# Patient Record
Sex: Female | Born: 1960 | Race: White | Hispanic: No | Marital: Married | Smoking: Never smoker
Health system: Southern US, Community
[De-identification: ages and names within clinical notes are randomized; demographics above are authoritative.]

## PROBLEM LIST (undated history)

## (undated) DIAGNOSIS — K219 Gastro-esophageal reflux disease without esophagitis: Secondary | ICD-10-CM

## (undated) DIAGNOSIS — F32A Depression, unspecified: Secondary | ICD-10-CM

## (undated) DIAGNOSIS — Z8632 Personal history of gestational diabetes: Secondary | ICD-10-CM

## (undated) DIAGNOSIS — F329 Major depressive disorder, single episode, unspecified: Secondary | ICD-10-CM

## (undated) DIAGNOSIS — E7800 Pure hypercholesterolemia, unspecified: Secondary | ICD-10-CM

## (undated) DIAGNOSIS — A609 Anogenital herpesviral infection, unspecified: Secondary | ICD-10-CM

## (undated) DIAGNOSIS — N3281 Overactive bladder: Secondary | ICD-10-CM

## (undated) DIAGNOSIS — F419 Anxiety disorder, unspecified: Secondary | ICD-10-CM

## (undated) DIAGNOSIS — M810 Age-related osteoporosis without current pathological fracture: Secondary | ICD-10-CM

## (undated) DIAGNOSIS — M549 Dorsalgia, unspecified: Secondary | ICD-10-CM

## (undated) DIAGNOSIS — E221 Hyperprolactinemia: Secondary | ICD-10-CM

## (undated) HISTORY — DX: Anxiety disorder, unspecified: F41.9

## (undated) HISTORY — PX: BUNIONECTOMY: SHX129

## (undated) HISTORY — DX: Age-related osteoporosis without current pathological fracture: M81.0

## (undated) HISTORY — PX: OTHER SURGICAL HISTORY: SHX169

## (undated) HISTORY — DX: Dorsalgia, unspecified: M54.9

## (undated) HISTORY — DX: Hyperprolactinemia: E22.1

## (undated) HISTORY — DX: Gastro-esophageal reflux disease without esophagitis: K21.9

## (undated) HISTORY — DX: Depression, unspecified: F32.A

## (undated) HISTORY — DX: Pure hypercholesterolemia, unspecified: E78.00

## (undated) HISTORY — DX: Major depressive disorder, single episode, unspecified: F32.9

## (undated) HISTORY — PX: CERVICAL DISC SURGERY: SHX588

## (undated) HISTORY — DX: Personal history of gestational diabetes: Z86.32

## (undated) HISTORY — DX: Anogenital herpesviral infection, unspecified: A60.9

## (undated) HISTORY — DX: Overactive bladder: N32.81

## (undated) HISTORY — PX: TOTAL HIP ARTHROPLASTY: SHX124

---

## 1999-03-14 ENCOUNTER — Other Ambulatory Visit: Admission: RE | Admit: 1999-03-14 | Discharge: 1999-03-14 | Payer: Self-pay | Admitting: Gynecology

## 1999-08-30 ENCOUNTER — Other Ambulatory Visit: Admission: RE | Admit: 1999-08-30 | Discharge: 1999-08-30 | Payer: Self-pay | Admitting: Gynecology

## 1999-10-22 ENCOUNTER — Other Ambulatory Visit: Admission: RE | Admit: 1999-10-22 | Discharge: 1999-10-22 | Payer: Self-pay | Admitting: Gynecology

## 1999-10-22 ENCOUNTER — Encounter (INDEPENDENT_AMBULATORY_CARE_PROVIDER_SITE_OTHER): Payer: Self-pay | Admitting: Specialist

## 2002-04-15 ENCOUNTER — Other Ambulatory Visit: Admission: RE | Admit: 2002-04-15 | Discharge: 2002-04-15 | Payer: Self-pay | Admitting: Gynecology

## 2003-05-16 ENCOUNTER — Other Ambulatory Visit: Admission: RE | Admit: 2003-05-16 | Discharge: 2003-05-16 | Payer: Self-pay | Admitting: Gynecology

## 2004-05-17 ENCOUNTER — Other Ambulatory Visit: Admission: RE | Admit: 2004-05-17 | Discharge: 2004-05-17 | Payer: Self-pay | Admitting: Gynecology

## 2005-05-20 ENCOUNTER — Other Ambulatory Visit: Admission: RE | Admit: 2005-05-20 | Discharge: 2005-05-20 | Payer: Self-pay | Admitting: Gynecology

## 2006-06-26 ENCOUNTER — Other Ambulatory Visit: Admission: RE | Admit: 2006-06-26 | Discharge: 2006-06-26 | Payer: Self-pay | Admitting: Gynecology

## 2007-06-30 ENCOUNTER — Other Ambulatory Visit: Admission: RE | Admit: 2007-06-30 | Discharge: 2007-06-30 | Payer: Self-pay | Admitting: Gynecology

## 2008-03-18 ENCOUNTER — Ambulatory Visit: Payer: Self-pay | Admitting: Gynecology

## 2008-05-30 ENCOUNTER — Emergency Department (HOSPITAL_BASED_OUTPATIENT_CLINIC_OR_DEPARTMENT_OTHER): Admission: EM | Admit: 2008-05-30 | Discharge: 2008-05-30 | Payer: Self-pay | Admitting: *Deleted

## 2008-07-08 ENCOUNTER — Other Ambulatory Visit: Admission: RE | Admit: 2008-07-08 | Discharge: 2008-07-08 | Payer: Self-pay | Admitting: Gynecology

## 2008-07-08 ENCOUNTER — Ambulatory Visit: Payer: Self-pay | Admitting: Gynecology

## 2008-07-08 ENCOUNTER — Encounter: Payer: Self-pay | Admitting: Gynecology

## 2008-10-28 ENCOUNTER — Ambulatory Visit: Payer: Self-pay | Admitting: Gynecology

## 2009-08-21 ENCOUNTER — Ambulatory Visit: Payer: Self-pay | Admitting: Gynecology

## 2009-08-21 ENCOUNTER — Other Ambulatory Visit: Admission: RE | Admit: 2009-08-21 | Discharge: 2009-08-21 | Payer: Self-pay | Admitting: Gynecology

## 2009-12-13 ENCOUNTER — Ambulatory Visit: Payer: Self-pay | Admitting: Gynecology

## 2010-10-01 ENCOUNTER — Encounter (INDEPENDENT_AMBULATORY_CARE_PROVIDER_SITE_OTHER): Payer: BC Managed Care – PPO | Admitting: Gynecology

## 2010-10-01 ENCOUNTER — Other Ambulatory Visit (HOSPITAL_COMMUNITY)
Admission: RE | Admit: 2010-10-01 | Discharge: 2010-10-01 | Disposition: A | Discharge status: Home / Self Care | Payer: BC Managed Care – PPO | Source: Ambulatory Visit | Attending: Gynecology | Admitting: Gynecology

## 2010-10-01 ENCOUNTER — Other Ambulatory Visit: Payer: Self-pay | Admitting: Gynecology

## 2010-10-01 DIAGNOSIS — Z01419 Encounter for gynecological examination (general) (routine) without abnormal findings: Secondary | ICD-10-CM

## 2010-10-01 DIAGNOSIS — Z124 Encounter for screening for malignant neoplasm of cervix: Secondary | ICD-10-CM | POA: Insufficient documentation

## 2010-10-01 DIAGNOSIS — E229 Hyperfunction of pituitary gland, unspecified: Secondary | ICD-10-CM

## 2010-10-01 DIAGNOSIS — R635 Abnormal weight gain: Secondary | ICD-10-CM

## 2010-10-01 DIAGNOSIS — Z833 Family history of diabetes mellitus: Secondary | ICD-10-CM

## 2010-10-01 DIAGNOSIS — Z13220 Encounter for screening for lipoid disorders: Secondary | ICD-10-CM

## 2010-10-03 ENCOUNTER — Other Ambulatory Visit: Payer: Self-pay | Admitting: Gynecology

## 2010-10-03 DIAGNOSIS — D352 Benign neoplasm of pituitary gland: Secondary | ICD-10-CM

## 2010-10-09 ENCOUNTER — Ambulatory Visit (HOSPITAL_COMMUNITY)
Admission: RE | Admit: 2010-10-09 | Discharge: 2010-10-09 | Disposition: A | Discharge status: Home / Self Care | Payer: BC Managed Care – PPO | Source: Ambulatory Visit | Attending: Gynecology | Admitting: Gynecology

## 2010-10-09 DIAGNOSIS — D352 Benign neoplasm of pituitary gland: Secondary | ICD-10-CM

## 2010-10-09 DIAGNOSIS — F329 Major depressive disorder, single episode, unspecified: Secondary | ICD-10-CM | POA: Insufficient documentation

## 2010-10-09 DIAGNOSIS — E229 Hyperfunction of pituitary gland, unspecified: Secondary | ICD-10-CM | POA: Insufficient documentation

## 2010-10-09 DIAGNOSIS — F3289 Other specified depressive episodes: Secondary | ICD-10-CM | POA: Insufficient documentation

## 2010-10-09 MED ORDER — GADOBENATE DIMEGLUMINE 529 MG/ML IV SOLN
10.0000 mL | Freq: Once | INTRAVENOUS | Status: AC | PRN
  Administered 2010-10-09: 10 mL via INTRAVENOUS

## 2010-11-05 ENCOUNTER — Other Ambulatory Visit: Payer: BC Managed Care – PPO

## 2011-11-27 ENCOUNTER — Encounter: Payer: Self-pay | Admitting: *Deleted

## 2011-12-04 ENCOUNTER — Ambulatory Visit (INDEPENDENT_AMBULATORY_CARE_PROVIDER_SITE_OTHER): Payer: BC Managed Care – PPO | Admitting: Gynecology

## 2011-12-04 ENCOUNTER — Encounter: Payer: Self-pay | Admitting: Gynecology

## 2011-12-04 VITALS — BP 112/70 | Ht 63.0 in | Wt 164.0 lb

## 2011-12-04 DIAGNOSIS — F419 Anxiety disorder, unspecified: Secondary | ICD-10-CM | POA: Insufficient documentation

## 2011-12-04 DIAGNOSIS — M8580 Other specified disorders of bone density and structure, unspecified site: Secondary | ICD-10-CM

## 2011-12-04 DIAGNOSIS — Z131 Encounter for screening for diabetes mellitus: Secondary | ICD-10-CM

## 2011-12-04 DIAGNOSIS — Z01419 Encounter for gynecological examination (general) (routine) without abnormal findings: Secondary | ICD-10-CM

## 2011-12-04 DIAGNOSIS — E221 Hyperprolactinemia: Secondary | ICD-10-CM

## 2011-12-04 DIAGNOSIS — F32A Depression, unspecified: Secondary | ICD-10-CM | POA: Insufficient documentation

## 2011-12-04 DIAGNOSIS — F411 Generalized anxiety disorder: Secondary | ICD-10-CM

## 2011-12-04 DIAGNOSIS — F329 Major depressive disorder, single episode, unspecified: Secondary | ICD-10-CM

## 2011-12-04 DIAGNOSIS — M949 Disorder of cartilage, unspecified: Secondary | ICD-10-CM

## 2011-12-04 DIAGNOSIS — E229 Hyperfunction of pituitary gland, unspecified: Secondary | ICD-10-CM

## 2011-12-04 DIAGNOSIS — Z13220 Encounter for screening for lipoid disorders: Secondary | ICD-10-CM

## 2011-12-04 LAB — CBC WITH DIFFERENTIAL/PLATELET
Basophils Absolute: 0 10*3/uL (ref 0.0–0.1)
Basophils Relative: 1 % (ref 0–1)
Eosinophils Relative: 3 % (ref 0–5)
HCT: 39.8 % (ref 36.0–46.0)
MCH: 29.1 pg (ref 26.0–34.0)
MCHC: 32.7 g/dL (ref 30.0–36.0)
MCV: 89.2 fL (ref 78.0–100.0)
Monocytes Absolute: 0.3 10*3/uL (ref 0.1–1.0)
RDW: 13.7 % (ref 11.5–15.5)

## 2011-12-04 NOTE — Progress Notes (Addendum)
Cyrstal Leitz Wieseler [birth year] [id number]        51 y.o.  for annual exam.  Several issues noted below.  Past medical history,surgical history, medications, allergies, family history and social history were all reviewed and documented in the EPIC chart. ROS:  Was performed and pertinent positives and negatives are included in the history.  Exam: Sherrilyn Rist chaperone present Filed Vitals:   12/04/11 1115  BP: 112/70   General appearance  Normal Skin grossly normal Head/Neck normal with no cervical or supraclavicular adenopathy thyroid normal Lungs  clear Cardiac RR, without RMG Abdominal  soft, nontender, without masses, organomegaly or hernia Breasts  examined lying and sitting without masses, retractions, discharge or axillary adenopathy. Pelvic  Ext/BUS/vagina  normal   Cervix  normal   Uterus  Axial to retroverted, normal size, shape and contour, midline and mobile nontender   Adnexa  Without masses or tenderness    Anus and perineum  normal   Rectovaginal  normal sphincter tone without palpated masses or tenderness.    Assessment/Plan:  51 y.o. female for annual exam.    1. Hyperprolactinemia. Patient has history of hyperprolactinemia in the 30 range although her values last year was 57. An MRI was normal in 2012. Will check prolactin now, as long as within similar range then we'll monitor. She's having no symptoms such as visual, headache or galactorrhea. 2. Postmenopausal. Patient off HRT doing well without symptoms or bleeding. 3. Mammography. Patient knows to schedule now she agrees to do so. SBE monthly reviewed. 4. Colonoscopy. Patient overdo and knows to schedule and agrees to do so. 5. Pap smear. No Pap smear done today. Patient has no history of abnormal Pap smears with last Pap smear 2012. She has numerous normal reports in her chart. I reviewed current screening guidelines we'll plan on less frequent screening every 3-5 years. 6. Osteopenia. DEXA 11/2009 showed T score -1.3. She  had been on Fosamax previously through her orthopedist due to fracture history. She did fracture this past year falling out of bed with a vertebral and foot fracture. We'll repeat DEXA now. Also check vitamin D level. 7. Depression. Patient is actively being followed and is on several medications by another physician. 8. Health maintenance. Baseline CBC glucose lipid profile urinalysis ordered along with her other blood work.    Dara Lords MD, 11:44 AM 12/04/2011

## 2011-12-04 NOTE — Patient Instructions (Signed)
Follow up for bone density as scheduled. Office will call you with your lab results. Otherwise follow up in one year for annual gynecologic exam.

## 2011-12-05 LAB — URINALYSIS W MICROSCOPIC + REFLEX CULTURE
Bacteria, UA: NONE SEEN
Crystals: NONE SEEN
Ketones, ur: NEGATIVE mg/dL
Nitrite: NEGATIVE
Specific Gravity, Urine: 1.022 (ref 1.005–1.030)
Urobilinogen, UA: 0.2 mg/dL (ref 0.0–1.0)

## 2011-12-05 LAB — VITAMIN D 25 HYDROXY (VIT D DEFICIENCY, FRACTURES): Vit D, 25-Hydroxy: 66 ng/mL (ref 30–89)

## 2011-12-05 LAB — LIPID PANEL
LDL Cholesterol: 127 mg/dL — ABNORMAL HIGH (ref 0–99)
Triglycerides: 79 mg/dL (ref <150)
VLDL: 16 mg/dL (ref 0–40)

## 2011-12-05 LAB — PROLACTIN: Prolactin: 35.8 ng/mL

## 2011-12-09 ENCOUNTER — Encounter: Payer: Self-pay | Admitting: Gynecology

## 2011-12-10 ENCOUNTER — Ambulatory Visit (INDEPENDENT_AMBULATORY_CARE_PROVIDER_SITE_OTHER): Payer: BC Managed Care – PPO

## 2011-12-10 DIAGNOSIS — M8580 Other specified disorders of bone density and structure, unspecified site: Secondary | ICD-10-CM

## 2011-12-10 DIAGNOSIS — Z13820 Encounter for screening for osteoporosis: Secondary | ICD-10-CM

## 2011-12-12 ENCOUNTER — Encounter: Payer: Self-pay | Admitting: Gynecology

## 2011-12-16 ENCOUNTER — Encounter: Payer: Self-pay | Admitting: Gynecology

## 2012-01-24 ENCOUNTER — Ambulatory Visit (AMBULATORY_SURGERY_CENTER): Payer: BC Managed Care – PPO | Admitting: *Deleted

## 2012-01-24 VITALS — Ht 63.0 in | Wt 152.6 lb

## 2012-01-24 DIAGNOSIS — Z1211 Encounter for screening for malignant neoplasm of colon: Secondary | ICD-10-CM

## 2012-01-24 MED ORDER — MOVIPREP 100 G PO SOLR: ORAL | Status: DC

## 2012-02-06 ENCOUNTER — Ambulatory Visit (AMBULATORY_SURGERY_CENTER): Payer: BC Managed Care – PPO | Admitting: Internal Medicine

## 2012-02-06 ENCOUNTER — Encounter: Payer: Self-pay | Admitting: Internal Medicine

## 2012-02-06 VITALS — BP 100/87 | HR 68 | Temp 95.9°F | Resp 11 | Ht 63.0 in | Wt 152.0 lb

## 2012-02-06 DIAGNOSIS — Z1211 Encounter for screening for malignant neoplasm of colon: Secondary | ICD-10-CM

## 2012-02-06 MED ORDER — SODIUM CHLORIDE 0.9 % IV SOLN: 500.0000 mL | INTRAVENOUS | Status: DC

## 2012-02-06 NOTE — Op Note (Signed)
[facility] [address] [place], [place]  [address]  COLONOSCOPY PROCEDURE REPORT  PATIENT:  [name], Jeanne  MR#:  [id number] BIRTHDATE:  03/03/1961, 51 yrs. old  GENDER:  female ENDOSCOPIST:  Hedwig Morton. Juanda Chance, MD REF. BY:  Colin Broach, M.D. PROCEDURE DATE:  02/06/2012 PROCEDURE:  Colonoscopy 62130 ASA CLASS:  Class I INDICATIONS:  colorectal cancer screening, average risk MEDICATIONS:   MAC sedation, administered by CRNA, propofol (Diprivan) 250 mg  DESCRIPTION OF PROCEDURE:   After the risks and benefits and of the procedure were explained, informed consent was obtained. Digital rectal exam was performed and revealed no rectal masses. The LB CF-[id number] endoscope was introduced through the anus and advanced to the cecum, which was identified by both the appendix and ileocecal valve.  The quality of the prep was excellent, using MoviPrep.  The instrument was then slowly withdrawn as the colon was fully examined. <<PROCEDUREIMAGES>>  FINDINGS:  No polyps or cancers were seen (see image1, image2, and image3).   Retroflexed views in the rectum revealed no abnormalities.    The scope was then withdrawn from the patient and the procedure completed.  COMPLICATIONS:  None ENDOSCOPIC IMPRESSION: 1) No polyps or cancers 2) Normal colonoscopy RECOMMENDATIONS: 1) High fiber diet.  REPEAT EXAM:  In 10 year(s) for.  ______________________________ Hedwig Morton. Juanda Chance, MD  CC:  n. eSIGNED:   Hedwig Morton. Brodie at 02/06/2012 10:48 AM  Figuereo, Erskine Squibb, [phone]

## 2012-02-06 NOTE — Patient Instructions (Addendum)
YOU HAD AN ENDOSCOPIC PROCEDURE TODAY AT THE [facility]: Refer to the procedure report that was given to you for any specific questions about what was found during the examination.  If the procedure report does not answer your questions, please call your gastroenterologist to clarify.  If you requested that your care partner not be given the details of your procedure findings, then the procedure report has been included in a sealed envelope for you to review at your convenience later.  YOU SHOULD EXPECT: Some feelings of bloating in the abdomen. Passage of more gas than usual.  Walking can help get rid of the air that was put into your GI tract during the procedure and reduce the bloating. If you had a lower endoscopy (such as a colonoscopy or flexible sigmoidoscopy) you may notice spotting of blood in your stool or on the toilet paper. If you underwent a bowel prep for your procedure, then you may not have a normal bowel movement for a few days.  DIET: Your first meal following the procedure should be a light meal and then it is ok to progress to your normal diet.  A half-sandwich or bowl of soup is an example of a good first meal.  Heavy or fried foods are harder to digest and may make you feel nauseous or bloated.  Likewise meals heavy in dairy and vegetables can cause extra gas to form and this can also increase the bloating.  Drink plenty of fluids but you should avoid alcoholic beverages for 24 hours.  ACTIVITY: Your care partner should take you home directly after the procedure.  You should plan to take it easy, moving slowly for the rest of the day.  You can resume normal activity the day after the procedure however you should NOT DRIVE or use heavy machinery for 24 hours (because of the sedation medicines used during the test).    SYMPTOMS TO REPORT IMMEDIATELY: A gastroenterologist can be reached at any hour.  During normal business hours, 8:30 AM to 5:00 PM Monday through Friday,  call [phone].  After hours and on weekends, please call the GI answering service at [phone] who will take a message and have the physician on call contact you.   Following lower endoscopy (colonoscopy or flexible sigmoidoscopy):  Excessive amounts of blood in the stool  Significant tenderness or worsening of abdominal pains  Swelling of the abdomen that is new, acute  Fever of 100F or higher  Following upper endoscopy (EGD)  Vomiting of blood or coffee ground material  New chest pain or pain under the shoulder blades  Painful or persistently difficult swallowing  New shortness of breath  Fever of 100F or higher  Black, tarry-looking stools  FOLLOW UP: If any biopsies were taken you will be contacted by phone or by letter within the next 1-3 weeks.  Call your gastroenterologist if you have not heard about the biopsies in 3 weeks.  Our staff will call the home number listed on your records the next business day following your procedure to check on you and address any questions or concerns that you may have at that time regarding the information given to you following your procedure. This is a courtesy call and so if there is no answer at the home number and we have not heard from you through the emergency physician on call, we will assume that you have returned to your regular daily activities without incident.  SIGNATURES/CONFIDENTIALITY: You and/or your care   partner have signed paperwork which will be entered into your electronic medical record.  These signatures attest to the fact that that the information above on your After Visit Summary has been reviewed and is understood.  Full responsibility of the confidentiality of this discharge information lies with you and/or your care-partner.  Normal colonoscopy.  High fiber diet information given. . 

## 2012-02-06 NOTE — Progress Notes (Signed)
Patient did not experience any of the following events: a burn prior to discharge; a fall within the facility; wrong site/side/patient/procedure/implant event; or a hospital transfer or hospital admission upon discharge from the facility. (G8907) Patient did not have preoperative order for IV antibiotic SSI prophylaxis. (G8918)  

## 2012-02-07 ENCOUNTER — Telehealth: Payer: Self-pay | Admitting: *Deleted

## 2012-02-07 NOTE — Telephone Encounter (Signed)
Left message on number given in admitting yesterday. ewm 

## 2012-12-09 ENCOUNTER — Encounter: Payer: Self-pay | Admitting: Gynecology

## 2012-12-31 ENCOUNTER — Encounter: Payer: Self-pay | Admitting: Gynecology

## 2012-12-31 ENCOUNTER — Ambulatory Visit (INDEPENDENT_AMBULATORY_CARE_PROVIDER_SITE_OTHER): Payer: BC Managed Care – PPO | Admitting: Gynecology

## 2012-12-31 VITALS — BP 120/64 | Ht 63.0 in | Wt 130.0 lb

## 2012-12-31 DIAGNOSIS — Z13220 Encounter for screening for lipoid disorders: Secondary | ICD-10-CM

## 2012-12-31 DIAGNOSIS — E229 Hyperfunction of pituitary gland, unspecified: Secondary | ICD-10-CM

## 2012-12-31 DIAGNOSIS — E221 Hyperprolactinemia: Secondary | ICD-10-CM

## 2012-12-31 DIAGNOSIS — Z01419 Encounter for gynecological examination (general) (routine) without abnormal findings: Secondary | ICD-10-CM

## 2012-12-31 LAB — CBC WITH DIFFERENTIAL/PLATELET
Eosinophils Relative: 2 % (ref 0–5)
Hemoglobin: 13.2 g/dL (ref 12.0–15.0)
Lymphocytes Relative: 27 % (ref 12–46)
Lymphs Abs: 1.8 10*3/uL (ref 0.7–4.0)
MCV: 87.2 fL (ref 78.0–100.0)
Monocytes Relative: 6 % (ref 3–12)
Platelets: 311 10*3/uL (ref 150–400)
RBC: 4.44 MIL/uL (ref 3.87–5.11)
WBC: 6.4 10*3/uL (ref 4.0–10.5)

## 2012-12-31 LAB — TSH: TSH: 1.757 u[IU]/mL (ref 0.350–4.500)

## 2012-12-31 LAB — LIPID PANEL
Cholesterol: 235 mg/dL — ABNORMAL HIGH (ref 0–200)
HDL: 71 mg/dL (ref >39)
Triglycerides: 60 mg/dL (ref <150)

## 2012-12-31 LAB — COMPREHENSIVE METABOLIC PANEL
ALT: 25 U/L (ref 0–35)
CO2: 29 mEq/L (ref 19–32)
Calcium: 9.6 mg/dL (ref 8.4–10.5)
Chloride: 99 mEq/L (ref 96–112)
Creat: 0.96 mg/dL (ref 0.50–1.10)
Sodium: 136 mEq/L (ref 135–145)
Total Protein: 6.8 g/dL (ref 6.0–8.3)

## 2012-12-31 LAB — PROLACTIN: Prolactin: 37.2 ng/mL

## 2012-12-31 NOTE — Progress Notes (Signed)
Mckenley Birenbaum Karn [birth year] [id number]        52 y.o.  [id number] for annual exam.  Doing well without complaints.  Past medical history,surgical history, medications, allergies, family history and social history were all reviewed and documented in the EPIC chart.  ROS:  Performed and pertinent positives and negatives are included in the history, assessment and plan .  Exam: Kim assistant Filed Vitals:   12/31/12 0842  BP: 120/64  Height: 5\' 3"  (1.6 m)  Weight: 130 lb (58.968 kg)   General appearance  Normal Skin grossly normal Head/Neck normal with no cervical or supraclavicular adenopathy thyroid normal Lungs  clear Cardiac RR, without RMG Abdominal  soft, nontender, without masses, organomegaly or hernia Breasts  examined lying and sitting without masses, retractions, discharge or axillary adenopathy. Pelvic  Ext/BUS/vagina  normal   Cervix  normal   Uterus  retroverted, normal size, shape and contour, midline and mobile nontender   Adnexa  Without masses or tenderness    Anus and perineum  normal   Rectovaginal  normal sphincter tone without palpated masses or tenderness.    Assessment/Plan:  52 y.o. [id number] female for annual exam.   1. Postmenopausal. Doing well without significant hot flashes night sweats vaginal dryness or dyspareunia. Continue to monitor. 2. Hyperprolactinemia. History of hyperprolactinemia with normal MRI 2012. Value 35 last year. Recheck prolactin this year. 3. Depression. Patient actively being followed by another physician and doing well. Continue to followup with them. 4. Mammography 11/2012. Continue with annual mammography. SBE monthly reviewed. 5. Pap smear 2012. No Pap smear done today. No history of significant abnormal Pap smears. Plan repeat Pap smear next year 3 year interval. 6. Colonoscopy 2013. Repeated their recommended interval. 7. DEXA 2013 normal. Plan repeat in 5 year interval. Increase calcium vitamin D reviewed. Check vitamin D level  today. 8. Health maintenance. Baseline CBC comprehensive metabolic panel lipid profile urinalysis TSH vitamin D and prolactin ordered. Follow up one year, sooner as needed.  Note: This document was prepared with a combination of digital dictation and possible smart phrase technology. Any transcriptional errors that result from this process are unintentional.   Dara Lords MD, 9:17 AM 12/31/2012

## 2012-12-31 NOTE — Patient Instructions (Signed)
Follow up in one year, sooner as needed. 

## 2013-01-01 LAB — URINALYSIS W MICROSCOPIC + REFLEX CULTURE
Bacteria, UA: NONE SEEN
Bilirubin Urine: NEGATIVE
Casts: NONE SEEN
Glucose, UA: NEGATIVE mg/dL
Hgb urine dipstick: NEGATIVE
Ketones, ur: NEGATIVE mg/dL
Leukocytes, UA: NEGATIVE
pH: 7.5 (ref 5.0–8.0)

## 2013-12-10 ENCOUNTER — Encounter: Payer: Self-pay | Admitting: Gynecology

## 2013-12-29 HISTORY — PX: BREAST SURGERY: SHX581

## 2014-01-31 ENCOUNTER — Ambulatory Visit (INDEPENDENT_AMBULATORY_CARE_PROVIDER_SITE_OTHER): Payer: 59 | Admitting: Gynecology

## 2014-01-31 ENCOUNTER — Other Ambulatory Visit (HOSPITAL_COMMUNITY)
Admission: RE | Admit: 2014-01-31 | Discharge: 2014-01-31 | Disposition: A | Discharge status: Home / Self Care | Payer: Commercial Managed Care - PPO | Source: Ambulatory Visit | Attending: Gynecology | Admitting: Gynecology

## 2014-01-31 ENCOUNTER — Encounter: Payer: Self-pay | Admitting: Gynecology

## 2014-01-31 VITALS — BP 122/78 | Ht 62.0 in | Wt 144.0 lb

## 2014-01-31 DIAGNOSIS — Z1151 Encounter for screening for human papillomavirus (HPV): Secondary | ICD-10-CM | POA: Insufficient documentation

## 2014-01-31 DIAGNOSIS — Z01419 Encounter for gynecological examination (general) (routine) without abnormal findings: Secondary | ICD-10-CM

## 2014-01-31 DIAGNOSIS — E229 Hyperfunction of pituitary gland, unspecified: Secondary | ICD-10-CM

## 2014-01-31 DIAGNOSIS — A609 Anogenital herpesviral infection, unspecified: Secondary | ICD-10-CM

## 2014-01-31 DIAGNOSIS — E221 Hyperprolactinemia: Secondary | ICD-10-CM

## 2014-01-31 DIAGNOSIS — B009 Herpesviral infection, unspecified: Secondary | ICD-10-CM

## 2014-01-31 LAB — CBC WITH DIFFERENTIAL/PLATELET
BASOS ABS: 0 10*3/uL (ref 0.0–0.1)
Basophils Relative: 0 % (ref 0–1)
EOS PCT: 2 % (ref 0–5)
Eosinophils Absolute: 0.1 10*3/uL (ref 0.0–0.7)
HEMATOCRIT: 39.4 % (ref 36.0–46.0)
Hemoglobin: 13.6 g/dL (ref 12.0–15.0)
LYMPHS ABS: 1.6 10*3/uL (ref 0.7–4.0)
LYMPHS PCT: 30 % (ref 12–46)
MCH: 30.2 pg (ref 26.0–34.0)
MCHC: 34.5 g/dL (ref 30.0–36.0)
MCV: 87.4 fL (ref 78.0–100.0)
MONO ABS: 0.3 10*3/uL (ref 0.1–1.0)
Monocytes Relative: 6 % (ref 3–12)
NEUTROS ABS: 3.3 10*3/uL (ref 1.7–7.7)
Neutrophils Relative %: 62 % (ref 43–77)
PLATELETS: 268 10*3/uL (ref 150–400)
RBC: 4.51 MIL/uL (ref 3.87–5.11)
RDW: 13.3 % (ref 11.5–15.5)
WBC: 5.3 10*3/uL (ref 4.0–10.5)

## 2014-01-31 LAB — LIPID PANEL
CHOLESTEROL: 184 mg/dL (ref 0–200)
HDL: 88 mg/dL (ref >39)
LDL Cholesterol: 84 mg/dL (ref 0–99)
Total CHOL/HDL Ratio: 2.1 Ratio
Triglycerides: 60 mg/dL (ref <150)
VLDL: 12 mg/dL (ref 0–40)

## 2014-01-31 LAB — COMPREHENSIVE METABOLIC PANEL
ALBUMIN: 4.4 g/dL (ref 3.5–5.2)
ALT: 19 U/L (ref 0–35)
AST: 19 U/L (ref 0–37)
Alkaline Phosphatase: 72 U/L (ref 39–117)
BUN: 19 mg/dL (ref 6–23)
CALCIUM: 9.7 mg/dL (ref 8.4–10.5)
CHLORIDE: 99 meq/L (ref 96–112)
CO2: 29 meq/L (ref 19–32)
CREATININE: 1.04 mg/dL (ref 0.50–1.10)
Glucose, Bld: 87 mg/dL (ref 70–99)
POTASSIUM: 4.4 meq/L (ref 3.5–5.3)
Sodium: 137 mEq/L (ref 135–145)
Total Bilirubin: 0.5 mg/dL (ref 0.2–1.2)
Total Protein: 6.8 g/dL (ref 6.0–8.3)

## 2014-01-31 LAB — TSH: TSH: 1.509 u[IU]/mL (ref 0.350–4.500)

## 2014-01-31 LAB — PROLACTIN: Prolactin: 28.9 ng/mL

## 2014-01-31 MED ORDER — VALACYCLOVIR HCL 500 MG PO TABS: 500.0000 mg | ORAL_TABLET | Freq: Two times a day (BID) | ORAL | Status: DC

## 2014-01-31 NOTE — Patient Instructions (Signed)
Followup in 1 year for annual exam, sooner if any issues.   You may obtain a copy of any labs that were done today by logging onto MyChart as outlined in the instructions provided with your AVS (after visit summary). The office will not call with normal lab results but certainly if there are any significant abnormalities then we will contact you.   Health Maintenance, Female A healthy lifestyle and preventative care can promote health and wellness.  Maintain regular health, dental, and eye exams.  Eat a healthy diet. Foods like vegetables, fruits, whole grains, low-fat dairy products, and lean protein foods contain the nutrients you need without too many calories. Decrease your intake of foods high in solid fats, added sugars, and salt. Get information about a proper diet from your caregiver, if necessary.  Regular physical exercise is one of the most important things you can do for your health. Most adults should get at least 150 minutes of moderate-intensity exercise (any activity that increases your heart rate and causes you to sweat) each week. In addition, most adults need muscle-strengthening exercises on 2 or more days a week.   Maintain a healthy weight. The body mass index (BMI) is a screening tool to identify possible weight problems. It provides an estimate of body fat based on height and weight. Your caregiver can help determine your BMI, and can help you achieve or maintain a healthy weight. For adults 20 years and older:  A BMI below 18.5 is considered underweight.  A BMI of 18.5 to 24.9 is normal.  A BMI of 25 to 29.9 is considered overweight.  A BMI of 30 and above is considered obese.  Maintain normal blood lipids and cholesterol by exercising and minimizing your intake of saturated fat. Eat a balanced diet with plenty of fruits and vegetables. Blood tests for lipids and cholesterol should begin at age 23 and be repeated every 5 years. If your lipid or cholesterol levels are  high, you are over 50, or you are a high risk for heart disease, you may need your cholesterol levels checked more frequently.Ongoing high lipid and cholesterol levels should be treated with medicines if diet and exercise are not effective.  If you smoke, find out from your caregiver how to quit. If you do not use tobacco, do not start.  Lung cancer screening is recommended for adults aged 58 80 years who are at high risk for developing lung cancer because of a history of smoking. Yearly low-dose computed tomography (CT) is recommended for people who have at least a 30-pack-year history of smoking and are a current smoker or have quit within the past 15 years. A pack year of smoking is smoking an average of 1 pack of cigarettes a day for 1 year (for example: 1 pack a day for 30 years or 2 packs a day for 15 years). Yearly screening should continue until the smoker has stopped smoking for at least 15 years. Yearly screening should also be stopped for people who develop a health problem that would prevent them from having lung cancer treatment.  If you are pregnant, do not drink alcohol. If you are breastfeeding, be very cautious about drinking alcohol. If you are not pregnant and choose to drink alcohol, do not exceed 1 drink per day. One drink is considered to be 12 ounces (355 mL) of beer, 5 ounces (148 mL) of wine, or 1.5 ounces (44 mL) of liquor.  Avoid use of street drugs. Do not share needles  with anyone. Ask for help if you need support or instructions about stopping the use of drugs.  High blood pressure causes heart disease and increases the risk of stroke. Blood pressure should be checked at least every 1 to 2 years. Ongoing high blood pressure should be treated with medicines, if weight loss and exercise are not effective.  If you are 12 to 55 years old, ask your caregiver if you should take aspirin to prevent strokes.  Diabetes screening involves taking a blood sample to check your fasting  blood sugar level. This should be done once every 3 years, after age 26, if you are within normal weight and without risk factors for diabetes. Testing should be considered at a younger age or be carried out more frequently if you are overweight and have at least 1 risk factor for diabetes.  Breast cancer screening is essential preventative care for women. You should practice "breast self-awareness." This means understanding the normal appearance and feel of your breasts and may include breast self-examination. Any changes detected, no matter how small, should be reported to a caregiver. Women in their 25s and 30s should have a clinical breast exam (CBE) by a caregiver as part of a regular health exam every 1 to 3 years. After age 10, women should have a CBE every year. Starting at age 24, women should consider having a mammogram (breast X-ray) every year. Women who have a family history of breast cancer should talk to their caregiver about genetic screening. Women at a high risk of breast cancer should talk to their caregiver about having an MRI and a mammogram every year.  Breast cancer gene (BRCA)-related cancer risk assessment is recommended for women who have family members with BRCA-related cancers. BRCA-related cancers include breast, ovarian, tubal, and peritoneal cancers. Having family members with these cancers may be associated with an increased risk for harmful changes (mutations) in the breast cancer genes BRCA1 and BRCA2. Results of the assessment will determine the need for genetic counseling and BRCA1 and BRCA2 testing.  The Pap test is a screening test for cervical cancer. Women should have a Pap test starting at age 61. Between ages 93 and 59, Pap tests should be repeated every 2 years. Beginning at age 75, you should have a Pap test every 3 years as long as the past 3 Pap tests have been normal. If you had a hysterectomy for a problem that was not cancer or a condition that could lead to  cancer, then you no longer need Pap tests. If you are between ages 63 and 52, and you have had normal Pap tests going back 10 years, you no longer need Pap tests. If you have had past treatment for cervical cancer or a condition that could lead to cancer, you need Pap tests and screening for cancer for at least 20 years after your treatment. If Pap tests have been discontinued, risk factors (such as a new sexual partner) need to be reassessed to determine if screening should be resumed. Some women have medical problems that increase the chance of getting cervical cancer. In these cases, your caregiver may recommend more frequent screening and Pap tests.  The human papillomavirus (HPV) test is an additional test that may be used for cervical cancer screening. The HPV test looks for the virus that can cause the cell changes on the cervix. The cells collected during the Pap test can be tested for HPV. The HPV test could be used to screen women aged  30 years and older, and should be used in women of any age who have unclear Pap test results. After the age of 52, women should have HPV testing at the same frequency as a Pap test.  Colorectal cancer can be detected and often prevented. Most routine colorectal cancer screening begins at the age of 60 and continues through age 4. However, your caregiver may recommend screening at an earlier age if you have risk factors for colon cancer. On a yearly basis, your caregiver may provide home test kits to check for hidden blood in the stool. Use of a small camera at the end of a tube, to directly examine the colon (sigmoidoscopy or colonoscopy), can detect the earliest forms of colorectal cancer. Talk to your caregiver about this at age 23, when routine screening begins. Direct examination of the colon should be repeated every 5 to 10 years through age 48, unless early forms of pre-cancerous polyps or small growths are found.  Hepatitis C blood testing is recommended for  all people born from [birth year] through 1967 and any individual with known risks for hepatitis C.  Practice safe sex. Use condoms and avoid high-risk sexual practices to reduce the spread of sexually transmitted infections (STIs). Sexually active women aged 73 and younger should be checked for Chlamydia, which is a common sexually transmitted infection. Older women with new or multiple partners should also be tested for Chlamydia. Testing for other STIs is recommended if you are sexually active and at increased risk.  Osteoporosis is a disease in which the bones lose minerals and strength with aging. This can result in serious bone fractures. The risk of osteoporosis can be identified using a bone density scan. Women ages 34 and over and women at risk for fractures or osteoporosis should discuss screening with their caregivers. Ask your caregiver whether you should be taking a calcium supplement or vitamin D to reduce the rate of osteoporosis.  Menopause can be associated with physical symptoms and risks. Hormone replacement therapy is available to decrease symptoms and risks. You should talk to your caregiver about whether hormone replacement therapy is right for you.  Use sunscreen. Apply sunscreen liberally and repeatedly throughout the day. You should seek shade when your shadow is shorter than you. Protect yourself by wearing long sleeves, pants, a wide-brimmed hat, and sunglasses year round, whenever you are outdoors.  Notify your caregiver of new moles or changes in moles, especially if there is a change in shape or color. Also notify your caregiver if a mole is larger than the size of a pencil eraser.  Stay current with your immunizations. Document Released: 12/31/2010 Document Revised: 10/12/2012 Document Reviewed: 12/31/2010 [facility] Patient Information 2014 [employer].

## 2014-01-31 NOTE — Addendum Note (Signed)
Addended by: Nelva Nay on: 01/31/2014 09:27 AM   Modules accepted: Orders

## 2014-01-31 NOTE — Progress Notes (Signed)
Giana Castner Steib [birth year] [id number]        54 y.o.  G4P0013 for annual exam.  Several issues noted below.  Past medical history,surgical history, problem list, medications, allergies, family history and social history were all reviewed and documented as reviewed in the EPIC chart.  ROS:  12 system ROS performed with pertinent positives and negatives included in the history, assessment and plan.   Additional significant findings :  None   Exam: Kim Counsellor Vitals:   01/31/14 0847  BP: 122/78  Height: 5\' 2"  (1.575 m)  Weight: 144 lb (65.318 kg)   General appearance:  Normal affect, orientation and appearance. Skin: Grossly normal HEENT: Without gross lesions.  No cervical or supraclavicular adenopathy. Thyroid normal.  Lungs:  Clear without wheezing, rales or rhonchi Cardiac: RR, without RMG Abdominal:  Soft, nontender, without masses, guarding, rebound, organomegaly or hernia Breasts:  Examined lying and sitting without masses, retractions, discharge or axillary adenopathy. Bilateral reduction scars noted. Pelvic:  Ext/BUS/vagina with mild generalized atrophic changes  Cervix with mild atrophic changes. Pap/HPV  Uterus anteverted, normal size, shape and contour, midline and mobile nontender   Adnexa  Without masses or tenderness    Anus and perineum  Normal   Rectovaginal  Normal sphincter tone without palpated masses or tenderness.    Assessment/Plan:  53 y.o. G76P0013 female for annual exam.   1. Postmenopausal/atrophic genital changes. Patient doing well without significant hot flashes, night sweats, vaginal dryness or dyspareunia. No vaginal bleeding. Continue to monitor. Report any vaginal bleeding. 2. Hyperprolactinemia. History of hyperprolactinemia with normal MRI 2012. Prolactin 37 last year which is stable. Recheck prolactin now. Continue to follow as long as it is within this range. Patient's asymptomatic with out breast symptoms or headache/visual  changes. 3. DEXA 2013 normal. History of prior DEXA 2011 with T score -1.3 at orthopedics office. Had been on Fosamax previously but had discontinued a number of years ago. Plan expectant management with repeat DEXA at five-year interval. Increase calcium vitamin D reviewed. Check vitamin D level today. 4. Depression. Patient actively being followed by her other physician on several medications which they are managing. 5. 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 Asked for refill on her Valtrex just have available in case of recurrence. The Valtrex 500 mg #20 with one refill provided. At the earliest onset of symptoms twice a day x5 days. 6. Pap smear 2012. Pap smear/HPV today. No history of significant abnormal Pap smears previously. Repeat at 3 at five-year interval per current screening guidelines assuming this Pap smear is normal. 7. Mammography 11/2013. Underwent reductive mammoplasty 12/2013. Scar is healed nicely. Continue with annual mammography. SBE monthly review. 8. Colonoscopy 2013. Repeat at their recommended interval. 9. Health maintenance. Baseline CBC comprehensive metabolic panel lipid profile urinalysis vitamin D TSH prolactin ordered. Patient is fasting this morning. Being followed for hypercholesterolemia on Lipitor. Will forward results to her primary physician for management. Followup in one year, sooner as needed.   Note: This document was prepared with digital dictation and possible smart phrase technology. Any transcriptional errors that result from this process are unintentional.   [name] MD, 9:15 AM 01/31/2014

## 2014-02-01 LAB — URINALYSIS W MICROSCOPIC + REFLEX CULTURE
Bacteria, UA: NONE SEEN
Bilirubin Urine: NEGATIVE
CRYSTALS: NONE SEEN
Casts: NONE SEEN
Glucose, UA: NEGATIVE mg/dL
Hgb urine dipstick: NEGATIVE
Ketones, ur: NEGATIVE mg/dL
LEUKOCYTES UA: NEGATIVE
NITRITE: NEGATIVE
PH: 6.5 (ref 5.0–8.0)
Protein, ur: NEGATIVE mg/dL
SPECIFIC GRAVITY, URINE: 1.018 (ref 1.005–1.030)
Squamous Epithelial / LPF: NONE SEEN
Urobilinogen, UA: 0.2 mg/dL (ref 0.0–1.0)

## 2014-02-01 LAB — VITAMIN D 25 HYDROXY (VIT D DEFICIENCY, FRACTURES): VIT D 25 HYDROXY: 59 ng/mL (ref 30–89)

## 2014-02-01 LAB — CYTOLOGY - PAP

## 2014-05-02 ENCOUNTER — Encounter: Payer: Self-pay | Admitting: Gynecology

## 2014-12-20 ENCOUNTER — Encounter: Payer: Self-pay | Admitting: Gynecology

## 2015-02-03 ENCOUNTER — Ambulatory Visit (INDEPENDENT_AMBULATORY_CARE_PROVIDER_SITE_OTHER): Payer: Commercial Managed Care - PPO | Admitting: Gynecology

## 2015-02-03 ENCOUNTER — Encounter: Payer: Self-pay | Admitting: Gynecology

## 2015-02-03 VITALS — BP 122/76 | Ht 62.0 in | Wt 143.0 lb

## 2015-02-03 DIAGNOSIS — Z01419 Encounter for gynecological examination (general) (routine) without abnormal findings: Secondary | ICD-10-CM

## 2015-02-03 DIAGNOSIS — E221 Hyperprolactinemia: Secondary | ICD-10-CM

## 2015-02-03 LAB — CBC WITH DIFFERENTIAL/PLATELET
Basophils Absolute: 0.1 10*3/uL (ref 0.0–0.1)
Basophils Relative: 1 % (ref 0–1)
Eosinophils Absolute: 0.1 10*3/uL (ref 0.0–0.7)
Eosinophils Relative: 1 % (ref 0–5)
HCT: 38.5 % (ref 36.0–46.0)
HEMOGLOBIN: 13.1 g/dL (ref 12.0–15.0)
LYMPHS PCT: 22 % (ref 12–46)
Lymphs Abs: 1.4 10*3/uL (ref 0.7–4.0)
MCH: 31 pg (ref 26.0–34.0)
MCHC: 34 g/dL (ref 30.0–36.0)
MCV: 91.2 fL (ref 78.0–100.0)
MONOS PCT: 6 % (ref 3–12)
MPV: 10.3 fL (ref 8.6–12.4)
Monocytes Absolute: 0.4 10*3/uL (ref 0.1–1.0)
Neutro Abs: 4.4 10*3/uL (ref 1.7–7.7)
Neutrophils Relative %: 70 % (ref 43–77)
Platelets: 281 10*3/uL (ref 150–400)
RBC: 4.22 MIL/uL (ref 3.87–5.11)
RDW: 13.5 % (ref 11.5–15.5)
WBC: 6.3 10*3/uL (ref 4.0–10.5)

## 2015-02-03 LAB — COMPREHENSIVE METABOLIC PANEL
ALK PHOS: 57 U/L (ref 33–130)
ALT: 22 U/L (ref 6–29)
AST: 25 U/L (ref 10–35)
Albumin: 4.2 g/dL (ref 3.6–5.1)
BUN: 20 mg/dL (ref 7–25)
CO2: 29 mmol/L (ref 20–31)
CREATININE: 0.99 mg/dL (ref 0.50–1.05)
Calcium: 10 mg/dL (ref 8.6–10.4)
Chloride: 100 mmol/L (ref 98–110)
GLUCOSE: 88 mg/dL (ref 65–99)
Potassium: 4.6 mmol/L (ref 3.5–5.3)
Sodium: 139 mmol/L (ref 135–146)
Total Bilirubin: 0.6 mg/dL (ref 0.2–1.2)
Total Protein: 6.8 g/dL (ref 6.1–8.1)

## 2015-02-03 LAB — LIPID PANEL
CHOL/HDL RATIO: 1.8 ratio (ref <=5.0)
CHOLESTEROL: 180 mg/dL (ref 125–200)
HDL: 98 mg/dL (ref >=46)
LDL Cholesterol: 72 mg/dL (ref <130)
Triglycerides: 49 mg/dL (ref <150)
VLDL: 10 mg/dL (ref <30)

## 2015-02-03 LAB — TSH: TSH: 1.238 u[IU]/mL (ref 0.350–4.500)

## 2015-02-03 LAB — PROLACTIN: Prolactin: 19.3 ng/mL

## 2015-02-03 NOTE — Progress Notes (Signed)
Jeanne [name] [birth year] [id number]        54 y.o.  G4P0013 for annual exam.  Doing well without complaints.  Past medical history,surgical history, problem list, medications, allergies, family history and social history were all reviewed and documented as reviewed in the EPIC chart.  ROS:  Performed with pertinent positives and negatives included in the history, assessment and plan.   Additional significant findings :  none   Exam: Kim Counsellor Vitals:   02/03/15 0912  BP: 122/76  Height: 5\' 2"  (1.575 m)  Weight: 143 lb (64.864 kg)   General appearance:  Normal affect, orientation and appearance. Skin: Grossly normal HEENT: Without gross lesions.  No cervical or supraclavicular adenopathy. Thyroid normal.  Lungs:  Clear without wheezing, rales or rhonchi Cardiac: RR, without RMG Abdominal:  Soft, nontender, without masses, guarding, rebound, organomegaly or hernia Breasts:  Examined lying and sitting without masses, retractions, discharge or axillary adenopathy.  Well-healed bilateral reduction scars noted Pelvic:  Ext/BUS/vagina normal with mild atrophic changes  Cervix normal with mild atrophic changes  Uterus anteverted, normal size, shape and contour, midline and mobile nontender   Adnexa  Without masses or tenderness    Anus and perineum  Normal   Rectovaginal  Normal sphincter tone without palpated masses or tenderness.    Assessment/Plan:  54 y.o. G78P0013 female for annual exam.   1. Postmenopausal/mild atrophic changes. Patient without significant symptoms of hot flushes, night sweats, vaginal dryness or dyspareunia. No vaginal bleeding. Continue to monitor report any issues. 2. Hyperprolactinemia. History of hyperprolactinemia with normal MRI 2012.  Prolactin 28 last year. 2014 value 37. Repeat prolactin today. 3. DEXA 2013 normal. DEXA 2011 T score -1.3 at orthopedics office. Had been on Fosamax but discontinued a number of years ago. Plan expectant management  with follow up DEXA at age 28. Check vitamin D level today. 4. Depression. Patients actively followed by another physician on several medications a are managing. 5. History of HSV. Has not had an outbreak in years. Will call if needs Valtrex. 6. Pap smear/HPV negative 2015.  No Pap smear done today. No history of abnormal Pap smears previously. Repeat at 3-5 year interval progression screening guidelines. 7. Mammography 11/2014. Continue with annual mammography. SBE monthly reviewed. 8. Colonoscopy 2013. Repeat at their recommended interval. 9. Health maintenance. Baseline labs to include CBC, has a metabolic panel lipid profile urinalysis vitamin D TSH and prolactin ordered. Follow up in one year, sooner as needed.   [name] MD, 9:37 AM 02/03/2015

## 2015-02-03 NOTE — Patient Instructions (Signed)
You may obtain a copy of any labs that were done today by logging onto MyChart as outlined in the instructions provided with your AVS (after visit summary). The office will not call with normal lab results but certainly if there are any significant abnormalities then we will contact you.   Health Maintenance, Female A healthy lifestyle and preventative care can promote health and wellness.  Maintain regular health, dental, and eye exams.  Eat a healthy diet. Foods like vegetables, fruits, whole grains, low-fat dairy products, and lean protein foods contain the nutrients you need without too many calories. Decrease your intake of foods high in solid fats, added sugars, and salt. Get information about a proper diet from your caregiver, if necessary.  Regular physical exercise is one of the most important things you can do for your health. Most adults should get at least 150 minutes of moderate-intensity exercise (any activity that increases your heart rate and causes you to sweat) each week. In addition, most adults need muscle-strengthening exercises on 2 or more days a week.   Maintain a healthy weight. The body mass index (BMI) is a screening tool to identify possible weight problems. It provides an estimate of body fat based on height and weight. Your caregiver can help determine your BMI, and can help you achieve or maintain a healthy weight. For adults 20 years and older:  A BMI below 18.5 is considered underweight.  A BMI of 18.5 to 24.9 is normal.  A BMI of 25 to 29.9 is considered overweight.  A BMI of 30 and above is considered obese.  Maintain normal blood lipids and cholesterol by exercising and minimizing your intake of saturated fat. Eat a balanced diet with plenty of fruits and vegetables. Blood tests for lipids and cholesterol should begin at age 61 and be repeated every 5 years. If your lipid or cholesterol levels are high, you are over 50, or you are a high risk for heart  disease, you may need your cholesterol levels checked more frequently.Ongoing high lipid and cholesterol levels should be treated with medicines if diet and exercise are not effective.  If you smoke, find out from your caregiver how to quit. If you do not use tobacco, do not start.  Lung cancer screening is recommended for adults aged 33 80 years who are at high risk for developing lung cancer because of a history of smoking. Yearly low-dose computed tomography (CT) is recommended for people who have at least a 30-pack-year history of smoking and are a current smoker or have quit within the past 15 years. A pack year of smoking is smoking an average of 1 pack of cigarettes a day for 1 year (for example: 1 pack a day for 30 years or 2 packs a day for 15 years). Yearly screening should continue until the smoker has stopped smoking for at least 15 years. Yearly screening should also be stopped for people who develop a health problem that would prevent them from having lung cancer treatment.  If you are pregnant, do not drink alcohol. If you are breastfeeding, be very cautious about drinking alcohol. If you are not pregnant and choose to drink alcohol, do not exceed 1 drink per day. One drink is considered to be 12 ounces (355 mL) of beer, 5 ounces (148 mL) of wine, or 1.5 ounces (44 mL) of liquor.  Avoid use of street drugs. Do not share needles with anyone. Ask for help if you need support or instructions about stopping  the use of drugs.  High blood pressure causes heart disease and increases the risk of stroke. Blood pressure should be checked at least every 1 to 2 years. Ongoing high blood pressure should be treated with medicines, if weight loss and exercise are not effective.  If you are 59 to 56 years old, ask your caregiver if you should take aspirin to prevent strokes.  Diabetes screening involves taking a blood sample to check your fasting blood sugar level. This should be done once every 3  years, after age 91, if you are within normal weight and without risk factors for diabetes. Testing should be considered at a younger age or be carried out more frequently if you are overweight and have at least 1 risk factor for diabetes.  Breast cancer screening is essential preventative care for women. You should practice "breast self-awareness." This means understanding the normal appearance and feel of your breasts and may include breast self-examination. Any changes detected, no matter how small, should be reported to a caregiver. Women in their 66s and 30s should have a clinical breast exam (CBE) by a caregiver as part of a regular health exam every 1 to 3 years. After age 101, women should have a CBE every year. Starting at age 100, women should consider having a mammogram (breast X-ray) every year. Women who have a family history of breast cancer should talk to their caregiver about genetic screening. Women at a high risk of breast cancer should talk to their caregiver about having an MRI and a mammogram every year.  Breast cancer gene (BRCA)-related cancer risk assessment is recommended for women who have family members with BRCA-related cancers. BRCA-related cancers include breast, ovarian, tubal, and peritoneal cancers. Having family members with these cancers may be associated with an increased risk for harmful changes (mutations) in the breast cancer genes BRCA1 and BRCA2. Results of the assessment will determine the need for genetic counseling and BRCA1 and BRCA2 testing.  The Pap test is a screening test for cervical cancer. Women should have a Pap test starting at age 57. Between ages 25 and 35, Pap tests should be repeated every 2 years. Beginning at age 37, you should have a Pap test every 3 years as long as the past 3 Pap tests have been normal. If you had a hysterectomy for a problem that was not cancer or a condition that could lead to cancer, then you no longer need Pap tests. If you are  between ages 50 and 76, and you have had normal Pap tests going back 10 years, you no longer need Pap tests. If you have had past treatment for cervical cancer or a condition that could lead to cancer, you need Pap tests and screening for cancer for at least 20 years after your treatment. If Pap tests have been discontinued, risk factors (such as a new sexual partner) need to be reassessed to determine if screening should be resumed. Some women have medical problems that increase the chance of getting cervical cancer. In these cases, your caregiver may recommend more frequent screening and Pap tests.  The human papillomavirus (HPV) test is an additional test that may be used for cervical cancer screening. The HPV test looks for the virus that can cause the cell changes on the cervix. The cells collected during the Pap test can be tested for HPV. The HPV test could be used to screen women aged 44 years and older, and should be used in women of any age  who have unclear Pap test results. After the age of 55, women should have HPV testing at the same frequency as a Pap test.  Colorectal cancer can be detected and often prevented. Most routine colorectal cancer screening begins at the age of 44 and continues through age 20. However, your caregiver may recommend screening at an earlier age if you have risk factors for colon cancer. On a yearly basis, your caregiver may provide home test kits to check for hidden blood in the stool. Use of a small camera at the end of a tube, to directly examine the colon (sigmoidoscopy or colonoscopy), can detect the earliest forms of colorectal cancer. Talk to your caregiver about this at age 86, when routine screening begins. Direct examination of the colon should be repeated every 5 to 10 years through age 13, unless early forms of pre-cancerous polyps or small growths are found.  Hepatitis C blood testing is recommended for all people born from [birth year] through 1967 and any  individual with known risks for hepatitis C.  Practice safe sex. Use condoms and avoid high-risk sexual practices to reduce the spread of sexually transmitted infections (STIs). Sexually active women aged 36 and younger should be checked for Chlamydia, which is a common sexually transmitted infection. Older women with new or multiple partners should also be tested for Chlamydia. Testing for other STIs is recommended if you are sexually active and at increased risk.  Osteoporosis is a disease in which the bones lose minerals and strength with aging. This can result in serious bone fractures. The risk of osteoporosis can be identified using a bone density scan. Women ages 20 and over and women at risk for fractures or osteoporosis should discuss screening with their caregivers. Ask your caregiver whether you should be taking a calcium supplement or vitamin D to reduce the rate of osteoporosis.  Menopause can be associated with physical symptoms and risks. Hormone replacement therapy is available to decrease symptoms and risks. You should talk to your caregiver about whether hormone replacement therapy is right for you.  Use sunscreen. Apply sunscreen liberally and repeatedly throughout the day. You should seek shade when your shadow is shorter than you. Protect yourself by wearing long sleeves, pants, a wide-brimmed hat, and sunglasses year round, whenever you are outdoors.  Notify your caregiver of new moles or changes in moles, especially if there is a change in shape or color. Also notify your caregiver if a mole is larger than the size of a pencil eraser.  Stay current with your immunizations. Document Released: 12/31/2010 Document Revised: 10/12/2012 Document Reviewed: 12/31/2010 [facility] Patient Information 2014 [employer].

## 2015-02-04 LAB — URINALYSIS W MICROSCOPIC + REFLEX CULTURE
Bacteria, UA: NONE SEEN [HPF]
Bilirubin Urine: NEGATIVE
CASTS: NONE SEEN [LPF]
Crystals: NONE SEEN [HPF]
GLUCOSE, UA: NEGATIVE
Hgb urine dipstick: NEGATIVE
Ketones, ur: NEGATIVE
LEUKOCYTES UA: NEGATIVE
Nitrite: NEGATIVE
PH: 7 (ref 5.0–8.0)
PROTEIN: NEGATIVE
RBC / HPF: NONE SEEN RBC/HPF (ref <=2)
Specific Gravity, Urine: 1.015 (ref 1.001–1.035)
Squamous Epithelial / LPF: NONE SEEN [HPF] (ref <=5)
WBC UA: NONE SEEN WBC/HPF (ref <=5)
YEAST: NONE SEEN [HPF]

## 2015-02-04 LAB — VITAMIN D 25 HYDROXY (VIT D DEFICIENCY, FRACTURES): Vit D, 25-Hydroxy: 46 ng/mL (ref 30–100)

## 2015-12-27 ENCOUNTER — Encounter: Payer: Self-pay | Admitting: Gynecology

## 2016-02-08 ENCOUNTER — Encounter: Payer: Self-pay | Admitting: Gynecology

## 2016-02-08 ENCOUNTER — Ambulatory Visit (INDEPENDENT_AMBULATORY_CARE_PROVIDER_SITE_OTHER): Payer: Commercial Managed Care - PPO | Admitting: Gynecology

## 2016-02-08 VITALS — BP 118/74 | Ht 63.0 in | Wt 142.0 lb

## 2016-02-08 DIAGNOSIS — Z01419 Encounter for gynecological examination (general) (routine) without abnormal findings: Secondary | ICD-10-CM | POA: Diagnosis not present

## 2016-02-08 DIAGNOSIS — N952 Postmenopausal atrophic vaginitis: Secondary | ICD-10-CM | POA: Diagnosis not present

## 2016-02-08 DIAGNOSIS — E221 Hyperprolactinemia: Secondary | ICD-10-CM | POA: Diagnosis not present

## 2016-02-08 NOTE — Patient Instructions (Signed)

## 2016-02-08 NOTE — Progress Notes (Signed)
    Jeanne [name] [id number]        55 y.o.  G4P0013  for annual exam.  Several issues noted below.  Past medical history,surgical history, problem list, medications, allergies, family history and social history were all reviewed and documented as reviewed in the EPIC chart.  ROS:  Performed with pertinent positives and negatives included in the history, assessment and plan.   Additional significant findings :  None   Exam: [name] assistant Vitals:   02/08/16 1507  BP: 118/74  Weight: 142 lb (64.4 kg)  Height: 5\' 3"  (1.6 m)   Body mass index is 25.15 kg/m.  General appearance:  Normal affect, orientation and appearance. Skin: Grossly normal HEENT: Without gross lesions.  No cervical or supraclavicular adenopathy. Thyroid normal.  Lungs:  Clear without wheezing, rales or rhonchi Cardiac: RR, without RMG Abdominal:  Soft, nontender, without masses, guarding, rebound, organomegaly or hernia Breasts:  Examined lying and sitting without masses, retractions, discharge or axillary adenopathy.  With well-healed bilateral reduction scars Pelvic:  Ext/BUS/Vagina with mild atrophic changes  Cervix with mild atrophic changes  Uterus anteverted, normal size, shape and contour, midline and mobile nontender   Adnexa without masses or tenderness    Anus and perineum normal   Rectovaginal normal sphincter tone without palpated masses or tenderness.    Assessment/Plan:  55 y.o. G73P0013 female for annual exam.   1. Postmenopausal/mild atrophic changes. Doing well without significant hot flushes, night sweats, vaginal dryness or any bleeding. Continue to monitor report any issues or bleeding. 2. History of hyperprolactinemia. Value 55 with normal MRI 2012. Follow up values 37, 28 and 19 last year. Check prolactin today. 3. DEXA 2017 reported normal. 4. History of HSV with rare outbreaks. Does not require Valtrex at this time. 5. Pap smear/HPV 2015 negative. No Pap smear today.  No history of significant abnormal Pap smears. 6. Colonoscopy 2013. Repeat at their recommended interval. 7. Mammography 11/2015. Continue with annual mammography when due. SBE monthly reviewed. 8. Health maintenance. Patient reports routine lab work done this year already elsewhere. Follow up 1 year, sooner as needed.   [name] MD, 3:37 PM 02/08/2016

## 2016-02-09 ENCOUNTER — Other Ambulatory Visit: Payer: Self-pay | Admitting: Gynecology

## 2016-02-09 DIAGNOSIS — R7989 Other specified abnormal findings of blood chemistry: Secondary | ICD-10-CM

## 2016-02-09 DIAGNOSIS — E229 Hyperfunction of pituitary gland, unspecified: Principal | ICD-10-CM

## 2016-02-09 LAB — PROLACTIN: Prolactin: 38.1 ng/mL — ABNORMAL HIGH

## 2016-09-23 ENCOUNTER — Other Ambulatory Visit: Payer: Commercial Managed Care - PPO

## 2016-09-23 DIAGNOSIS — R7989 Other specified abnormal findings of blood chemistry: Secondary | ICD-10-CM

## 2016-09-23 DIAGNOSIS — E229 Hyperfunction of pituitary gland, unspecified: Principal | ICD-10-CM

## 2016-09-24 LAB — PROLACTIN: Prolactin: 23.9 ng/mL

## 2016-12-04 ENCOUNTER — Encounter: Payer: Self-pay | Admitting: Gynecology

## 2017-02-11 ENCOUNTER — Encounter: Payer: Self-pay | Admitting: Gynecology

## 2017-02-11 ENCOUNTER — Ambulatory Visit (INDEPENDENT_AMBULATORY_CARE_PROVIDER_SITE_OTHER): Payer: Commercial Managed Care - PPO | Admitting: Gynecology

## 2017-02-11 VITALS — BP 116/74 | Ht 62.5 in | Wt 137.0 lb

## 2017-02-11 DIAGNOSIS — E221 Hyperprolactinemia: Secondary | ICD-10-CM | POA: Diagnosis not present

## 2017-02-11 DIAGNOSIS — R102 Pelvic and perineal pain: Secondary | ICD-10-CM

## 2017-02-11 DIAGNOSIS — N952 Postmenopausal atrophic vaginitis: Secondary | ICD-10-CM | POA: Diagnosis not present

## 2017-02-11 DIAGNOSIS — L99 Other disorders of skin and subcutaneous tissue in diseases classified elsewhere: Secondary | ICD-10-CM | POA: Diagnosis not present

## 2017-02-11 DIAGNOSIS — Z01411 Encounter for gynecological examination (general) (routine) with abnormal findings: Secondary | ICD-10-CM | POA: Diagnosis not present

## 2017-02-11 LAB — COMPREHENSIVE METABOLIC PANEL
ALBUMIN: 4.4 g/dL (ref 3.6–5.1)
ALT: 18 U/L (ref 6–29)
AST: 19 U/L (ref 10–35)
Alkaline Phosphatase: 68 U/L (ref 33–130)
BILIRUBIN TOTAL: 0.4 mg/dL (ref 0.2–1.2)
BUN: 22 mg/dL (ref 7–25)
CO2: 28 mmol/L (ref 20–32)
Calcium: 9.5 mg/dL (ref 8.6–10.4)
Chloride: 102 mmol/L (ref 98–110)
Creat: 0.97 mg/dL (ref 0.50–1.05)
Glucose, Bld: 89 mg/dL (ref 65–99)
Potassium: 4.5 mmol/L (ref 3.5–5.3)
Sodium: 139 mmol/L (ref 135–146)
TOTAL PROTEIN: 6.6 g/dL (ref 6.1–8.1)

## 2017-02-11 LAB — URINALYSIS W MICROSCOPIC + REFLEX CULTURE
BACTERIA UA: NONE SEEN [HPF]
Bilirubin Urine: NEGATIVE
CASTS: NONE SEEN [LPF]
CRYSTALS: NONE SEEN [HPF]
Glucose, UA: NEGATIVE
Hgb urine dipstick: NEGATIVE
Ketones, ur: NEGATIVE
Leukocytes, UA: NEGATIVE
Nitrite: NEGATIVE
PROTEIN: NEGATIVE
RBC / HPF: NONE SEEN RBC/HPF (ref <=2)
SPECIFIC GRAVITY, URINE: 1.02 (ref 1.001–1.035)
Squamous Epithelial / LPF: NONE SEEN [HPF] (ref <=5)
WBC, UA: NONE SEEN WBC/HPF (ref <=5)
YEAST: NONE SEEN [HPF]
pH: 5.5 (ref 5.0–8.0)

## 2017-02-11 LAB — PROLACTIN: PROLACTIN: 17.2 ng/mL

## 2017-02-11 NOTE — Patient Instructions (Signed)
Followup in one year for annual exam, sooner as needed. 

## 2017-02-11 NOTE — Progress Notes (Signed)
Jeanne [name] [birth year] [id number]        56 y.o.  G4P0013 for annual exam.  Patient also notes several weeks ago she had some pelvic/lower abdominal pressure with more frequent voiding. No frank dysuria or urgency. No low back pain fever or chills. Took over-the-counter AZO and notes that the symptoms have resolved. No longer having frequency dysuria or urgency low back pain fever or chills. Also asked about her nails seem to be slightly orange in color particularly her fingernails. No skin changes hair changes, nausea, vomiting, diarrhea, constipation.  Past medical history,surgical history, problem list, medications, allergies, family history and social history were all reviewed and documented as reviewed in the EPIC chart.  ROS:  Performed with pertinent positives and negatives included in the history, assessment and plan.   Additional significant findings :  None   Exam: [name] assistant Vitals:   02/11/17 0817  BP: 116/74  Weight: 137 lb (62.1 kg)  Height: 5' 2.5" (1.588 m)   Body mass index is 24.66 kg/m.  General appearance:  Normal affect, orientation and appearance. Skin: Grossly normal. Nails appear normal without significant color changes. HEENT: Without gross lesions.  No cervical or supraclavicular adenopathy. Thyroid normal. Eyes without conjunctival color changes Lungs:  Clear without wheezing, rales or rhonchi Cardiac: RR, without RMG Abdominal:  Soft, nontender, without masses, guarding, rebound, organomegaly or hernia Breasts:  Examined lying and sitting without masses, retractions, discharge or axillary adenopathy.  Well-healed bilateral reduction scars. Pelvic:  Ext, BUS, Vagina: With atrophic changes  Cervix: With atrophic changes  Uterus: Anteverted, normal size, shape and contour, midline and mobile nontender   Adnexa: Without masses or tenderness    Anus and perineum: Normal   Rectovaginal: Normal sphincter tone without palpated masses or  tenderness.    Assessment/Plan:  56 y.o. G4P0013 female for annual exam.   1. Postmenopausal/mild atrophic changes. No significant hot flushes, night sweats, vaginal dryness or any vaginal bleeding. Continue monitor report any issues or bleeding. 2. Reports nails slight orange in color.  No gross discoloration on exam. Reviewed differential to include age-related versus metabolic. Will check baseline comprehensive metabolic panel and liver functions and bilirubin.  Assuming all normal. Continue to monitor. If it does seem to be worsening she'll follow up with dermatology. 3. Lower abdominal/pelvic pressure suggesting low-grade UTI previously. Seems to have spontaneously cleared. No current evidence of UTI on exam or historically. Check baseline urinalysis now. Follow up if symptoms recur. 4. History of hyperprolactinemia. Value 57 with normal MRI 2012.  Follow up values all lower with last check 08/2016 at 23. Recheck prolactin now. 5. DEXA 2013 normal. Noted was done in 2017 on last year exam but questioning patient today she states that it was only done in 2013. We will plan follow up repeat DEXA at age 56. 56. Mammography 11/2016. Continue with annual mammography when due. Self awareness. Breast exam normal today. 7. Colonoscopy 2013. Repeat at their recommended interval. 8. Pap smear/HPV 2015. No Pap smear done today. No history of abnormal Pap smears. Plan repeat Pap smear at 56 year interval per current screening guidelines. 9. Health maintenance. No routine lab work done as patient does this elsewhere. Follow up 1 year, sooner as needed.  Additional time in excess of her routine gynecologic exam was spent in direct face to face counseling and coordination of care in regards to her pelvic pressure and nail color changes.    [name] MD, 8:37 AM 02/11/2017

## 2017-05-21 ENCOUNTER — Ambulatory Visit (INDEPENDENT_AMBULATORY_CARE_PROVIDER_SITE_OTHER): Payer: Commercial Managed Care - PPO | Admitting: Gynecology

## 2017-05-21 ENCOUNTER — Encounter: Payer: Self-pay | Admitting: Gynecology

## 2017-05-21 VITALS — BP 118/76

## 2017-05-21 DIAGNOSIS — N3000 Acute cystitis without hematuria: Secondary | ICD-10-CM

## 2017-05-21 MED ORDER — NITROFURANTOIN MONOHYD MACRO 100 MG PO CAPS: 100.0000 mg | ORAL_CAPSULE | Freq: Two times a day (BID) | ORAL | 0 refills | Status: DC

## 2017-05-21 NOTE — Patient Instructions (Signed)
Take the antibiotic twice daily for 7 days.  Follow-up if your symptoms persist, worsen or recur. 

## 2017-05-21 NOTE — Progress Notes (Signed)
    Jeanne [name] [birth year] [id number]        56 y.o.  G4P0013 presents noting suprapubic pressure and mild intermittent dysuria since August.  Has come and gone.  Has treated with over-the-counter Azo noting relief of her symptoms but they seem to return.  No frequency, urgency, low back pain, fever or chills.  No nausea vomiting diarrhea constipation.  Past medical history,surgical history, problem list, medications, allergies, family history and social history were all reviewed and documented in the EPIC chart.  Directed ROS with pertinent positives and negatives documented in the history of present illness/assessment and plan.  Exam: Jeanne [name] assistant Vitals:   05/21/17 1221  BP: 118/76   General appearance:  Normal Spine straight without CVA tenderness Abdomen soft nontender without masses guarding rebound Pelvic external BUS vagina with atrophic changes.  Cervix with atrophic changes.  Uterus grossly normal midline mobile nontender.  Adnexa without mass.  Assessment/Plan:  56 y.o. [id number] with history of intermittent urinary symptoms.  Exam is normal.  Urine analysis consistent with UTI.  Reviewed with patient I think she has a low-level cystitis that she is intermittently treating the symptoms with Azo but not resolving the bacteriuria.  Recommend Macrobid 100 mg twice daily times 7 days.  Follow-up if symptoms persist, worsen or recur.    Jeanne Auerbach MD, 12:40 PM 05/21/2017

## 2017-05-21 NOTE — Addendum Note (Signed)
Addended by: Nelva Nay on: 05/21/2017 02:37 PM   Modules accepted: Orders

## 2017-05-24 LAB — URINE CULTURE
MICRO NUMBER: 81319714
SPECIMEN QUALITY: ADEQUATE

## 2017-05-24 LAB — URINALYSIS W MICROSCOPIC + REFLEX CULTURE
Bilirubin Urine: NEGATIVE
GLUCOSE, UA: NEGATIVE
Hyaline Cast: NONE SEEN /LPF
Ketones, ur: NEGATIVE
NITRITES URINE, INITIAL: NEGATIVE
PH: 6 (ref 5.0–8.0)
Protein, ur: NEGATIVE
SPECIFIC GRAVITY, URINE: 1.02 (ref 1.001–1.03)
WBC, UA: >=60 /HPF — AB (ref 0–5)

## 2017-05-24 LAB — CULTURE INDICATED

## 2017-12-10 ENCOUNTER — Encounter: Payer: Self-pay | Admitting: Gynecology

## 2018-02-17 ENCOUNTER — Ambulatory Visit (INDEPENDENT_AMBULATORY_CARE_PROVIDER_SITE_OTHER): Payer: Commercial Managed Care - PPO | Admitting: Gynecology

## 2018-02-17 ENCOUNTER — Encounter: Payer: Self-pay | Admitting: Gynecology

## 2018-02-17 VITALS — BP 116/76 | Ht 62.0 in | Wt 142.0 lb

## 2018-02-17 DIAGNOSIS — N952 Postmenopausal atrophic vaginitis: Secondary | ICD-10-CM | POA: Diagnosis not present

## 2018-02-17 DIAGNOSIS — Z01419 Encounter for gynecological examination (general) (routine) without abnormal findings: Secondary | ICD-10-CM | POA: Diagnosis not present

## 2018-02-17 DIAGNOSIS — A609 Anogenital herpesviral infection, unspecified: Secondary | ICD-10-CM

## 2018-02-17 MED ORDER — VALACYCLOVIR HCL 500 MG PO TABS: 500.0000 mg | ORAL_TABLET | Freq: Two times a day (BID) | ORAL | 1 refills | Status: DC

## 2018-02-17 NOTE — Patient Instructions (Signed)
Follow-up in 1 year for annual exam, sooner as needed. 

## 2018-02-17 NOTE — Progress Notes (Signed)
    Jeanne [name] [birth year] [id number]        57 y.o.  [id number] for annual gynecologic exam.  Without gynecologic complaints  Past medical history,surgical history, problem list, medications, allergies, family history and social history were all reviewed and documented as reviewed in the EPIC chart.  ROS:  Performed with pertinent positives and negatives included in the history, assessment and plan.   Additional significant findings : None   Exam: [name] assistant Vitals:   02/17/18 0842  BP: 116/76  Weight: 142 lb (64.4 kg)  Height: 5\' 2"  (1.575 m)   Body mass index is 25.97 kg/m.  General appearance:  Normal affect, orientation and appearance. Skin: Grossly normal HEENT: Without gross lesions.  No cervical or supraclavicular adenopathy. Thyroid normal.  Lungs:  Clear without wheezing, rales or rhonchi Cardiac: RR, without RMG Abdominal:  Soft, nontender, without masses, guarding, rebound, organomegaly or hernia Breasts:  Examined lying and sitting without masses, retractions, discharge or axillary adenopathy.  Bilateral well-healed reduction scars Pelvic:  Ext, BUS, Vagina: Normal with mild atrophic changes  Cervix: Normal  Uterus: Anteverted, normal size, shape and contour, midline and mobile nontender   Adnexa: Without masses or tenderness    Anus and perineum: Normal   Rectovaginal: Normal sphincter tone without palpated masses or tenderness.    Assessment/Plan:  57 y.o. G39P0013 female for annual gynecologic exam.   1. Postmenopausal/atrophic genital changes.  No significant menopausal symptoms or any vaginal bleeding. 2. History of hyperprolactinemia.  Prolactin level 17 last year.  Highest value 57 with normal MRI in 2012.  No breast complaints or any galactorrhea.  No headaches or visual changes.  Options to continuing to monitor prolactin's versus holding on blood work and reporting any symptoms if they occur reviewed.  Patient comfortable with stop screening at  this time. 3. Mammography 11/2017.  Continue with annual mammography when due.  Breast exam normal today. 4. DEXA 2013 normal.  Plan repeat DEXA next year at age 42. 70. Pap smear/HPV 01/2014.  No Pap smear done today.  Plan repeat Pap smear next year at 5-year interval per current screening guidelines. 6. Colonoscopy 2013.  Repeat at their recommended interval. 7. History of rare HSV outbreaks.  Requests refill of Valtrex to have available if needed.  Valtrex 500 mg twice daily times several days with outbreak #20 with 1 refill provided. 8. Health maintenance.  No routine lab work done as patient does this elsewhere.  Follow-up 1 year, sooner as needed.   [name] MD, 9:12 AM 02/17/2018

## 2018-05-09 DIAGNOSIS — G4700 Insomnia, unspecified: Secondary | ICD-10-CM

## 2018-05-09 DIAGNOSIS — F330 Major depressive disorder, recurrent, mild: Secondary | ICD-10-CM

## 2018-05-27 ENCOUNTER — Ambulatory Visit (INDEPENDENT_AMBULATORY_CARE_PROVIDER_SITE_OTHER): Payer: Commercial Managed Care - PPO | Admitting: Psychiatry

## 2018-05-27 ENCOUNTER — Encounter: Payer: Self-pay | Admitting: Psychiatry

## 2018-05-27 VITALS — BP 140/84 | HR 93

## 2018-05-27 DIAGNOSIS — G4700 Insomnia, unspecified: Secondary | ICD-10-CM

## 2018-05-27 DIAGNOSIS — F3342 Major depressive disorder, recurrent, in full remission: Secondary | ICD-10-CM | POA: Diagnosis not present

## 2018-05-27 DIAGNOSIS — F900 Attention-deficit hyperactivity disorder, predominantly inattentive type: Secondary | ICD-10-CM

## 2018-05-27 MED ORDER — METHYLPHENIDATE HCL 20 MG PO TABS: ORAL_TABLET | ORAL | 0 refills | Status: DC

## 2018-05-27 MED ORDER — AMITRIPTYLINE HCL 10 MG PO TABS: 40.0000 mg | ORAL_TABLET | Freq: Every day | ORAL | 1 refills | Status: DC

## 2018-05-27 MED ORDER — BUPROPION HCL ER (XL) 300 MG PO TB24: 300.0000 mg | ORAL_TABLET | ORAL | 1 refills | Status: DC

## 2018-05-27 MED ORDER — DULOXETINE HCL 60 MG PO CPEP: 60.0000 mg | ORAL_CAPSULE | ORAL | 1 refills | Status: DC

## 2018-05-27 MED ORDER — PERPHENAZINE 2 MG PO TABS: 2.0000 mg | ORAL_TABLET | Freq: Every day | ORAL | 1 refills | Status: DC

## 2018-05-27 NOTE — Progress Notes (Signed)
Jeanne [name] [id number] [birth year] 57 y.o.  Subjective:   Patient ID:  Jeanne [name] is a 58 y.o. (DOB 01/07/1961) female.  Chief Complaint:  Chief Complaint  Patient presents with  . Follow-up    h/o Depression, Anxiety, ADHD, Insomnia    HPI Jeanne [name] presents to the office today for follow-up of depression, anxiety, and attention deficit. She reports that her mood has been stable. "I don't usually have anxiety" other than worry about one of her sons. She reports that her energy and motivation have been good and has already been Christmas shopping. Sleeping well. Reports stable appetite. She reports that her concentration has been "much better." Reports that she is enjoying things again- "not burdened."  Denies SI.   "I feel the best I have felt in a very, very long time."   Reports that her middle son is having difficulty with substance dependence.   Past medication trials: Prozac- took for approximately 30 to 40 years Zoloft Wellbutrin Amitriptyline Perphenazine Methylphenidate Adderall Abilify Lamictal-itching Cymbalta Latuda Cerefolin   Review of Systems:  Review of Systems  Musculoskeletal: Positive for back pain. Negative for gait problem.       Having injection today in lower back for chronic pain.   Neurological: Negative for tremors and headaches.  Psychiatric/Behavioral:       Please refer to HPI    Medications: I have reviewed the patient's current medications.  Current Outpatient Medications  Medication Sig Dispense Refill  . atorvastatin (LIPITOR) 20 MG tablet Take 10 mg by mouth daily.     Marland Kitchen buPROPion (WELLBUTRIN XL) 300 MG 24 hr tablet Take 1 tablet (300 mg total) by mouth every morning. 90 tablet 1  . Calcium Carbonate-Vitamin D (CALCIUM + D PO) Take by mouth.    . DULoxetine (CYMBALTA) 60 MG capsule Take 1 capsule (60 mg total) by mouth every morning. 90 capsule 1  . gabapentin (NEURONTIN) 300 MG capsule Take 300 mg by mouth 3 (three) times  daily.    Derrill Memo ON 06/16/2018] methylphenidate (RITALIN) 20 MG tablet 1 tab qam & 1/2 tab qnoon 45 tablet 0  . perphenazine (TRILAFON) 2 MG tablet Take 1 tablet (2 mg total) by mouth at bedtime. 90 tablet 1  . polyethylene glycol powder (MIRALAX) powder Take 17 g by mouth daily.    . valACYclovir (VALTREX) 500 MG tablet Take 1 tablet (500 mg total) by mouth 2 (two) times daily. (Patient taking differently: Take 500 mg by mouth 2 (two) times daily as needed. ) 20 tablet 1  . zolpidem (AMBIEN) 10 MG tablet Take 15 mg by mouth at bedtime as needed (1.5 tabs).     Marland Kitchen amitriptyline (ELAVIL) 10 MG tablet Take 4 tablets (40 mg total) by mouth at bedtime. For 10 mg 360 tablet 1  . [START ON 07/14/2018] methylphenidate (RITALIN) 20 MG tablet Take 1 tab po q am and 1/2 tab po q afternoon 45 tablet 0  . [START ON 08/11/2018] methylphenidate (RITALIN) 20 MG tablet Take 1 tab po q am and 1/2 po q noon 45 tablet 0  . omeprazole (PRILOSEC) 20 MG capsule Take 20 mg by mouth daily.     No current facility-administered medications for this visit.     Medication Side Effects: Other: Dry mouth  Allergies:  Allergies  Allergen Reactions  . Lamictal [Lamotrigine] Itching    Past Medical History:  Diagnosis Date  . Acid reflux   . Anxiety   . Back pain   .  Depression   . Elevated cholesterol   . HSV (herpes simplex virus) anogenital infection   . Hx gestational diabetes   . Hyperprolactinemia ([place])    valued 57 with normal MRI 2012    Family History  Problem Relation Age of Onset  . Hypertension Mother   . Depression Mother   . Heart disease Father   . Hypertension Father   . Heart attack Father 72       heart attack, died  . Alcohol abuse Father   . Diabetes Maternal Grandmother   . Cancer Maternal Grandmother        colon  . Colon cancer Maternal Grandmother 29  . Anxiety disorder Maternal Grandmother   . Depression Maternal Grandmother   . Diabetes Maternal Grandfather   . Drug abuse  Son   . Drug abuse Son   . Drug abuse Son     Social History   Socioeconomic History  . Marital status: Married    Spouse name: Not on file  . Number of children: Not on file  . Years of education: Not on file  . Highest education level: Not on file  Occupational History  . Not on file  Social Needs  . Financial resource strain: Not on file  . Food insecurity:    Worry: Not on file    Inability: Not on file  . Transportation needs:    Medical: Not on file    Non-medical: Not on file  Tobacco Use  . Smoking status: Never Smoker  . Smokeless tobacco: Never Used  Substance and Sexual Activity  . Alcohol use: Yes    Alcohol/week: 0.0 standard drinks    Comment: Rare  . Drug use: No  . Sexual activity: Yes    Birth control/protection: Post-menopausal, Surgical    Comment: vasectomy-1st intercourse 7 yo-5 partners  Lifestyle  . Physical activity:    Days per week: Not on file    Minutes per session: Not on file  . Stress: Not on file  Relationships  . Social connections:    Talks on phone: Not on file    Gets together: Not on file    Attends religious service: Not on file    Active member of club or organization: Not on file    Attends meetings of clubs or organizations: Not on file    Relationship status: Not on file  . Intimate partner violence:    Fear of current or ex partner: Not on file    Emotionally abused: Not on file    Physically abused: Not on file    Forced sexual activity: Not on file  Other Topics Concern  . Not on file  Social History Narrative  . Not on file    Past Medical History, Surgical history, Social history, and Family history were reviewed and updated as appropriate.   Please see review of systems for further details on the patient's review from today.   Objective:   Physical Exam:  BP 140/84   Pulse 93   LMP 07/01/2002   Physical Exam  Constitutional: She is oriented to person, place, and time. She appears well-developed. No  distress.  Musculoskeletal: She exhibits no deformity.  Neurological: She is alert and oriented to person, place, and time. Coordination normal.  Psychiatric: She has a normal mood and affect. Her speech is normal and behavior is normal. Judgment and thought content normal. Her mood appears not anxious. Her affect is not angry, not blunt, not labile and  not inappropriate. Cognition and memory are normal. She does not exhibit a depressed mood. She expresses no homicidal and no suicidal ideation. She expresses no suicidal plans and no homicidal plans.  Insight intact. No auditory or visual hallucinations. No delusions.     Lab Review:     Component Value Date/Time   NA 139 02/11/2017 0838   K 4.5 02/11/2017 0838   CL 102 02/11/2017 0838   CO2 28 02/11/2017 0838   GLUCOSE 89 02/11/2017 0838   BUN 22 02/11/2017 0838   CREATININE 0.97 02/11/2017 0838   CALCIUM 9.5 02/11/2017 0838   PROT 6.6 02/11/2017 0838   ALBUMIN 4.4 02/11/2017 0838   AST 19 02/11/2017 0838   ALT 18 02/11/2017 0838   ALKPHOS 68 02/11/2017 0838   BILITOT 0.4 02/11/2017 0838       Component Value Date/Time   WBC 6.3 02/03/2015 0943   RBC 4.22 02/03/2015 0943   HGB 13.1 02/03/2015 0943   HCT 38.5 02/03/2015 0943   PLT 281 02/03/2015 0943   MCV 91.2 02/03/2015 0943   MCH 31.0 02/03/2015 0943   MCHC 34.0 02/03/2015 0943   RDW 13.5 02/03/2015 0943   LYMPHSABS 1.4 02/03/2015 0943   MONOABS 0.4 02/03/2015 0943   EOSABS 0.1 02/03/2015 0943   BASOSABS 0.1 02/03/2015 0943    No results found for: POCLITH, LITHIUM   No results found for: PHENYTOIN, PHENOBARB, VALPROATE, CBMZ   .res Assessment: Plan:   Continue Cymbalta for depression and anxiety. Continue Wellbutrin XL 300 mg for depression Continue methylphenidate for attention deficit. Continue amitriptyline for depression, insomnia, and headaches. Continue perphenazine for depression. Recurrent major depressive disorder, in full remission ([place]) - Plan:  amitriptyline (ELAVIL) 10 MG tablet, buPROPion (WELLBUTRIN XL) 300 MG 24 hr tablet, perphenazine (TRILAFON) 2 MG tablet, DULoxetine (CYMBALTA) 60 MG capsule  Insomnia, unspecified type - Plan: amitriptyline (ELAVIL) 10 MG tablet  Attention deficit hyperactivity disorder (ADHD), predominantly inattentive type - Plan: methylphenidate (RITALIN) 20 MG tablet, methylphenidate (RITALIN) 20 MG tablet, methylphenidate (RITALIN) 20 MG tablet, DISCONTINUED: methylphenidate (RITALIN) 20 MG tablet, DISCONTINUED: methylphenidate (RITALIN) 20 MG tablet, DISCONTINUED: methylphenidate (RITALIN) 20 MG tablet  Please see After Visit Summary for patient specific instructions.  Future Appointments  Date Time Provider [place]  08/17/2018  9:00 AM Thayer Headings, PMHNP [facility] None    No orders of the defined types were placed in this encounter.     -------------------------------

## 2018-06-26 ENCOUNTER — Ambulatory Visit (INDEPENDENT_AMBULATORY_CARE_PROVIDER_SITE_OTHER): Payer: Commercial Managed Care - PPO | Admitting: Gynecology

## 2018-06-26 ENCOUNTER — Encounter: Payer: Self-pay | Admitting: Gynecology

## 2018-06-26 VITALS — BP 120/82

## 2018-06-26 DIAGNOSIS — R350 Frequency of micturition: Secondary | ICD-10-CM | POA: Diagnosis not present

## 2018-06-26 DIAGNOSIS — R102 Pelvic and perineal pain: Secondary | ICD-10-CM | POA: Diagnosis not present

## 2018-06-26 MED ORDER — NITROFURANTOIN MONOHYD MACRO 100 MG PO CAPS: 100.0000 mg | ORAL_CAPSULE | Freq: Two times a day (BID) | ORAL | 0 refills | Status: DC

## 2018-06-26 NOTE — Addendum Note (Signed)
Addended by: Lorine Bears on: 06/26/2018 12:52 PM   Modules accepted: Orders

## 2018-06-26 NOTE — Patient Instructions (Signed)
Take the antibiotic twice daily for 7 days.  Follow-up for the ultrasound as scheduled.

## 2018-06-26 NOTE — Progress Notes (Signed)
    Jeanne [name] [birth year] [id number]        57 y.o.  G4P0013 presents with several months of on and off pelvic pressure and urinary frequency.  Comes and goes.  No urgency low back pain fever or chills.  No diarrhea constipation.  No vaginal bleeding.  Past medical history,surgical history, problem list, medications, allergies, family history and social history were all reviewed and documented in the EPIC chart.  Directed ROS with pertinent positives and negatives documented in the history of present illness/assessment and plan.  Exam: [name] assistant Vitals:   06/26/18 1025  BP: 120/82   General appearance:  Normal Abdomen soft nontender without masses guarding rebound Pelvic external BUS vagina with atrophic changes.  Cervix with atrophic changes.  Uterus normal size midline mobile nontender.  Adnexa without masses or tenderness.  Assessment/Plan:  57 y.o. [name] with history suggesting low-grade UTI.  Urine analysis is negative.  Intermittent nature questionable interstitial cystitis.  Recommended arbitrary trial of antibiotic for low-grade intermittent infection.  Will treat with Macrobid 100 mg twice daily x7 days.  We will also schedule baseline ultrasound to rule out nonpalpable abnormalities.  If symptoms persist and ultrasound is negative will refer to urology.    [name] MD, 10:53 AM 06/26/2018

## 2018-06-27 LAB — URINALYSIS, COMPLETE W/RFL CULTURE
Bacteria, UA: NONE SEEN /HPF
Bilirubin Urine: NEGATIVE
Glucose, UA: NEGATIVE
HGB URINE DIPSTICK: NEGATIVE
Hyaline Cast: NONE SEEN /LPF
KETONES UR: NEGATIVE
LEUKOCYTE ESTERASE: NEGATIVE
Nitrites, Initial: NEGATIVE
Protein, ur: NEGATIVE
RBC / HPF: NONE SEEN /HPF (ref 0–2)
Specific Gravity, Urine: 1.015 (ref 1.001–1.03)
WBC UA: NONE SEEN /HPF (ref 0–5)
pH: 8 (ref 5.0–8.0)

## 2018-06-27 LAB — NO CULTURE INDICATED

## 2018-07-10 ENCOUNTER — Telehealth: Payer: Self-pay

## 2018-07-10 NOTE — Telephone Encounter (Signed)
Prior auth approved for Methylphenidate 20 Mg tabs from 07/03/2018-07/04/2019.

## 2018-08-05 ENCOUNTER — Other Ambulatory Visit: Payer: Self-pay | Admitting: Psychiatry

## 2018-08-17 ENCOUNTER — Other Ambulatory Visit: Payer: Self-pay

## 2018-08-17 ENCOUNTER — Ambulatory Visit (INDEPENDENT_AMBULATORY_CARE_PROVIDER_SITE_OTHER): Payer: Commercial Managed Care - PPO | Admitting: Psychiatry

## 2018-08-17 ENCOUNTER — Ambulatory Visit: Payer: Self-pay | Admitting: Gynecology

## 2018-08-17 ENCOUNTER — Encounter: Payer: Self-pay | Admitting: Psychiatry

## 2018-08-17 VITALS — BP 124/90 | HR 98

## 2018-08-17 DIAGNOSIS — F3342 Major depressive disorder, recurrent, in full remission: Secondary | ICD-10-CM

## 2018-08-17 DIAGNOSIS — G4700 Insomnia, unspecified: Secondary | ICD-10-CM | POA: Diagnosis not present

## 2018-08-17 DIAGNOSIS — F900 Attention-deficit hyperactivity disorder, predominantly inattentive type: Secondary | ICD-10-CM

## 2018-08-17 MED ORDER — BUPROPION HCL ER (XL) 300 MG PO TB24: 300.0000 mg | ORAL_TABLET | ORAL | 1 refills | Status: DC

## 2018-08-17 MED ORDER — METHYLPHENIDATE HCL 20 MG PO TABS: ORAL_TABLET | ORAL | 0 refills | Status: DC

## 2018-08-17 MED ORDER — PERPHENAZINE 2 MG PO TABS: 2.0000 mg | ORAL_TABLET | Freq: Every day | ORAL | 1 refills | Status: DC

## 2018-08-17 MED ORDER — ZOLPIDEM TARTRATE 10 MG PO TABS: 15.0000 mg | ORAL_TABLET | Freq: Every day | ORAL | 1 refills | Status: DC

## 2018-08-17 MED ORDER — AMITRIPTYLINE HCL 10 MG PO TABS: 40.0000 mg | ORAL_TABLET | Freq: Every day | ORAL | 1 refills | Status: DC

## 2018-08-17 MED ORDER — DULOXETINE HCL 60 MG PO CPEP: 60.0000 mg | ORAL_CAPSULE | ORAL | 1 refills | Status: DC

## 2018-08-17 NOTE — Progress Notes (Signed)
Jeanne [name] [id number] 4/48/1858 58 y.o.  Subjective:   Patient ID:  Jeanne [name] is a 58 y.o. (DOB 07-05-1960) female.  Chief Complaint:  Chief Complaint  Patient presents with  . Follow-up    ADD, h/o insomnia and depression    HPI Jeanne [name] presents to the office today for follow-up of mood, anxiety, insomnia, and add. She denies any current complaints. She reports that her mood has been "good." Denies anxiety. She reports that she has been sleeping well. Reports that her concentration has been adequate. Denies change in appetite. Denies SI.   Reports that she occasionally will take Methylphenidate on her days off.   Past medication trials: Prozac- took for approximately 30 to 40 years Zoloft Wellbutrin Amitriptyline Perphenazine Methylphenidate Adderall Abilify Lamictal-itching Cymbalta Latuda Cerefolin  Review of Systems:  Review of Systems  Cardiovascular: Negative for palpitations.  Musculoskeletal: Negative for gait problem.  Neurological: Negative for tremors.  Psychiatric/Behavioral:       Please refer to HPI    Medications: I have reviewed the patient's current medications.  Current Outpatient Medications  Medication Sig Dispense Refill  . amitriptyline (ELAVIL) 10 MG tablet Take 4 tablets (40 mg total) by mouth at bedtime. For 10 mg 360 tablet 1  . atorvastatin (LIPITOR) 20 MG tablet Take 10 mg by mouth daily.     Marland Kitchen buPROPion (WELLBUTRIN XL) 300 MG 24 hr tablet Take 1 tablet (300 mg total) by mouth every morning. 90 tablet 1  . Calcium Carbonate-Vitamin D (CALCIUM + D PO) Take by mouth.    . DULoxetine (CYMBALTA) 60 MG capsule Take 1 capsule (60 mg total) by mouth every morning. 90 capsule 1  . gabapentin (NEURONTIN) 300 MG capsule Take 300 mg by mouth 4 (four) times daily.     Derrill Memo ON 09/03/2018] methylphenidate (RITALIN) 20 MG tablet Take 1 tab po q am and 1/2 po q noon 45 tablet 0  . [START ON 10/01/2018] methylphenidate (RITALIN) 20 MG  tablet 1 tab qam & 1/2 tab qnoon 45 tablet 0  . omeprazole (PRILOSEC) 20 MG capsule Take 20 mg by mouth daily as needed.     Marland Kitchen perphenazine (TRILAFON) 2 MG tablet Take 1 tablet (2 mg total) by mouth at bedtime. 90 tablet 1  . polyethylene glycol powder (MIRALAX) powder Take 17 g by mouth daily.    . valACYclovir (VALTREX) 500 MG tablet Take 1 tablet (500 mg total) by mouth 2 (two) times daily. (Patient taking differently: Take 500 mg by mouth 2 (two) times daily as needed. ) 20 tablet 1  . [START ON 10/29/2018] zolpidem (AMBIEN) 10 MG tablet Take 1.5 tablets (15 mg total) by mouth at bedtime. 135 tablet 1  . [START ON 10/29/2018] methylphenidate (RITALIN) 20 MG tablet Take 1 tab po q am and 1/2 po q noon 45 tablet 0   No current facility-administered medications for this visit.     Medication Side Effects: Other: Dry mouth  Allergies:  Allergies  Allergen Reactions  . Lamictal [Lamotrigine] Itching    Past Medical History:  Diagnosis Date  . Acid reflux   . Anxiety   . Back pain   . Depression   . Elevated cholesterol   . HSV (herpes simplex virus) anogenital infection   . Hx gestational diabetes   . Hyperprolactinemia ([place])    valued 85 with normal MRI 2012    Family History  Problem Relation Age of Onset  . Hypertension Mother   .  Depression Mother   . Heart disease Father   . Hypertension Father   . Heart attack Father 21       heart attack, died  . Alcohol abuse Father   . Diabetes Maternal Grandmother   . Cancer Maternal Grandmother        colon  . Colon cancer Maternal Grandmother 14  . Anxiety disorder Maternal Grandmother   . Depression Maternal Grandmother   . Diabetes Maternal Grandfather   . Drug abuse Son   . Drug abuse Son   . Drug abuse Son     Social History   Socioeconomic History  . Marital status: Married    Spouse name: Not on file  . Number of children: Not on file  . Years of education: Not on file  . Highest education level: Not on file   Occupational History  . Not on file  Social Needs  . Financial resource strain: Not on file  . Food insecurity:    Worry: Not on file    Inability: Not on file  . Transportation needs:    Medical: Not on file    Non-medical: Not on file  Tobacco Use  . Smoking status: Never Smoker  . Smokeless tobacco: Never Used  Substance and Sexual Activity  . Alcohol use: Yes    Alcohol/week: 0.0 standard drinks    Comment: Rare  . Drug use: No  . Sexual activity: Yes    Birth control/protection: Post-menopausal, Surgical, None    Comment: vasectomy-1st intercourse 23 yo-5 partners  Lifestyle  . Physical activity:    Days per week: Not on file    Minutes per session: Not on file  . Stress: Not on file  Relationships  . Social connections:    Talks on phone: Not on file    Gets together: Not on file    Attends religious service: Not on file    Active member of club or organization: Not on file    Attends meetings of clubs or organizations: Not on file    Relationship status: Not on file  . Intimate partner violence:    Fear of current or ex partner: Not on file    Emotionally abused: Not on file    Physically abused: Not on file    Forced sexual activity: Not on file  Other Topics Concern  . Not on file  Social History Narrative  . Not on file    Past Medical History, Surgical history, Social history, and Family history were reviewed and updated as appropriate.   Please see review of systems for further details on the patient's review from today.   Objective:   Physical Exam:  BP 124/90   Pulse 98   LMP 07/01/2002   Physical Exam Constitutional:      General: She is not in acute distress.    Appearance: She is well-developed.  Musculoskeletal:        General: No deformity.  Neurological:     Mental Status: She is alert and oriented to person, place, and time.     Coordination: Coordination normal.  Psychiatric:        Attention and Perception: Attention and  perception normal. She does not perceive auditory or visual hallucinations.        Mood and Affect: Mood normal. Mood is not anxious or depressed. Affect is not labile, blunt, angry or inappropriate.        Speech: Speech normal.  Behavior: Behavior normal.        Thought Content: Thought content normal. Thought content does not include homicidal or suicidal ideation. Thought content does not include homicidal or suicidal plan.        Cognition and Memory: Cognition and memory normal.        Judgment: Judgment normal.     Comments: Insight intact. No delusions.      Lab Review:     Component Value Date/Time   NA 139 02/11/2017 0838   K 4.5 02/11/2017 0838   CL 102 02/11/2017 0838   CO2 28 02/11/2017 0838   GLUCOSE 89 02/11/2017 0838   BUN 22 02/11/2017 0838   CREATININE 0.97 02/11/2017 0838   CALCIUM 9.5 02/11/2017 0838   PROT 6.6 02/11/2017 0838   ALBUMIN 4.4 02/11/2017 0838   AST 19 02/11/2017 0838   ALT 18 02/11/2017 0838   ALKPHOS 68 02/11/2017 0838   BILITOT 0.4 02/11/2017 0838       Component Value Date/Time   WBC 6.3 02/03/2015 0943   RBC 4.22 02/03/2015 0943   HGB 13.1 02/03/2015 0943   HCT 38.5 02/03/2015 0943   PLT 281 02/03/2015 0943   MCV 91.2 02/03/2015 0943   MCH 31.0 02/03/2015 0943   MCHC 34.0 02/03/2015 0943   RDW 13.5 02/03/2015 0943   LYMPHSABS 1.4 02/03/2015 0943   MONOABS 0.4 02/03/2015 0943   EOSABS 0.1 02/03/2015 0943   BASOSABS 0.1 02/03/2015 0943    No results found for: POCLITH, LITHIUM   No results found for: PHENYTOIN, PHENOBARB, VALPROATE, CBMZ   .res Assessment: Plan:   Continue current plan of care since mood, anxiety, insomnia, and ADD s/s are currently well controlled.  Continue Cymbalta for depression and anxiety. Continue Wellbutrin XL 300 mg for depression Continue methylphenidate for attention deficit. Continue amitriptyline for depression, insomnia, and headaches. Continue perphenazine for depression. Attention  deficit hyperactivity disorder (ADHD), predominantly inattentive type - Plan: methylphenidate (RITALIN) 20 MG tablet, methylphenidate (RITALIN) 20 MG tablet, methylphenidate (RITALIN) 20 MG tablet  Recurrent major depressive disorder, in full remission ([place]) - Plan: amitriptyline (ELAVIL) 10 MG tablet, buPROPion (WELLBUTRIN XL) 300 MG 24 hr tablet, DULoxetine (CYMBALTA) 60 MG capsule, perphenazine (TRILAFON) 2 MG tablet  Insomnia, unspecified type - Plan: amitriptyline (ELAVIL) 10 MG tablet, zolpidem (AMBIEN) 10 MG tablet  Please see After Visit Summary for patient specific instructions.  Future Appointments  Date Time Provider [place]  12/03/2018  8:30 AM Thayer Headings, PMHNP CP-CP None    No orders of the defined types were placed in this encounter.     -------------------------------

## 2018-09-10 ENCOUNTER — Other Ambulatory Visit: Payer: Self-pay | Admitting: Psychiatry

## 2018-09-10 ENCOUNTER — Telehealth: Payer: Self-pay | Admitting: Psychiatry

## 2018-09-10 DIAGNOSIS — F3342 Major depressive disorder, recurrent, in full remission: Secondary | ICD-10-CM

## 2018-09-10 NOTE — Telephone Encounter (Signed)
Patient's spouse Sonia Side called and stated the patient needs a refill on perphenazine 2 mg. Tab sent to [facility] in [place], Sonia Side stated the pharmacy told him to contact provider to expedite this request bc pt has 1 tab left

## 2018-09-10 NOTE — Telephone Encounter (Signed)
It's already been submitted this morning.

## 2018-12-03 ENCOUNTER — Other Ambulatory Visit: Payer: Self-pay

## 2018-12-03 ENCOUNTER — Encounter: Payer: Self-pay | Admitting: Psychiatry

## 2018-12-03 ENCOUNTER — Ambulatory Visit: Payer: Commercial Managed Care - PPO | Admitting: Psychiatry

## 2018-12-03 DIAGNOSIS — G4700 Insomnia, unspecified: Secondary | ICD-10-CM | POA: Diagnosis not present

## 2018-12-03 DIAGNOSIS — F3342 Major depressive disorder, recurrent, in full remission: Secondary | ICD-10-CM

## 2018-12-03 DIAGNOSIS — F900 Attention-deficit hyperactivity disorder, predominantly inattentive type: Secondary | ICD-10-CM | POA: Diagnosis not present

## 2018-12-03 MED ORDER — PERPHENAZINE 2 MG PO TABS: 2.0000 mg | ORAL_TABLET | Freq: Every day | ORAL | 1 refills | Status: DC

## 2018-12-03 MED ORDER — METHYLPHENIDATE HCL 20 MG PO TABS: ORAL_TABLET | ORAL | 0 refills | Status: DC

## 2018-12-03 MED ORDER — ZOLPIDEM TARTRATE 10 MG PO TABS: 15.0000 mg | ORAL_TABLET | Freq: Every day | ORAL | 1 refills | Status: DC

## 2018-12-03 MED ORDER — BUPROPION HCL ER (XL) 300 MG PO TB24: 300.0000 mg | ORAL_TABLET | ORAL | 1 refills | Status: DC

## 2018-12-03 MED ORDER — DULOXETINE HCL 60 MG PO CPEP: 60.0000 mg | ORAL_CAPSULE | ORAL | 1 refills | Status: DC

## 2018-12-03 MED ORDER — AMITRIPTYLINE HCL 10 MG PO TABS: 40.0000 mg | ORAL_TABLET | Freq: Every day | ORAL | 1 refills | Status: DC

## 2018-12-03 NOTE — Progress Notes (Signed)
Chrisy Hillebrand Panzer [id number] 58 y.o.  Subjective:   Patient ID:  Jeanne [name] is a 58 y.o. (DOB 1961/01/19) female.  Chief Complaint:  Chief Complaint  Patient presents with  . Follow-up    ADD, h/o Depression and insomnia    HPI Jeanne [name] presents to the office today for follow-up of ADD, insomnia, and h/o depression. 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 Denies anxiety. Occasional low moods in response to circumstances. Denies persistent depressed moods. She reports that she has been sleeping well. Appetite has been good. She reports energy and motivation have been good. 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 Denies SI.   Having foot surgery today for bunions.    Review of Systems:  Review of Systems  Musculoskeletal: Negative for gait problem.       Foot pain related to bunions  Neurological: Negative for tremors.  Psychiatric/Behavioral:       Please refer to HPI    Medications: I have reviewed the patient's current medications.  Current Outpatient Medications  Medication Sig Dispense Refill  . atorvastatin (LIPITOR) 20 MG tablet Take 10 mg by mouth daily.     . Calcium Carbonate-Vitamin D (CALCIUM + D PO) Take by mouth.    . gabapentin (NEURONTIN) 300 MG capsule Take 300 mg by mouth 4 (four) times daily.     . methylphenidate (RITALIN) 20 MG tablet Take 1 tab po q am and 1/2 po q noon 45 tablet 0  . [START ON 12/31/2018] methylphenidate (RITALIN) 20 MG tablet Take 1 tab po q am and 1/2 po q noon 45 tablet 0  . [START ON 01/28/2019] methylphenidate (RITALIN) 20 MG tablet 1 tab qam & 1/2 tab qnoon 45 tablet 0  . perphenazine (TRILAFON) 2 MG tablet Take 1 tablet (2 mg total) by mouth at bedtime. 90 tablet 1  . polyethylene glycol powder (MIRALAX) powder Take 17 g by mouth daily.     . valACYclovir (VALTREX) 500 MG tablet Take 1 tablet (500 mg total) by mouth 2 (two) times daily. (Patient taking differently: Take 500 mg by mouth 2 (two) times daily as needed. ) 20 tablet 1  . [START ON 01/25/2019] zolpidem (AMBIEN) 10 MG tablet Take 1.5 tablets (15 mg total) by mouth at bedtime. 135 tablet 1  . amitriptyline (ELAVIL) 10 MG tablet Take 4 tablets (40 mg total) by mouth at bedtime. For 10 mg 360 tablet 1  . buPROPion (WELLBUTRIN XL) 300 MG 24 hr tablet Take 1 tablet (300 mg total) by mouth every morning. 90 tablet 1  . DULoxetine (CYMBALTA) 60 MG capsule Take 1 capsule (60 mg total) by mouth every morning. 90 capsule 1  . omeprazole (PRILOSEC) 20 MG capsule Take 20 mg by mouth daily as needed.      No current facility-administered medications for this visit.     Medication Side Effects: Other: Dry mouth  Allergies:  Allergies  Allergen Reactions  . Lamictal [Lamotrigine] Itching    Past Medical History:  Diagnosis Date  . Acid reflux   . Anxiety   . Back pain   . Depression   . Elevated cholesterol   . HSV (herpes simplex virus) anogenital infection   . Hx gestational diabetes   . Hyperprolactinemia ([place])    valued 87 with normal MRI  2012    Family History  Problem Relation Age of Onset  . Hypertension Mother   . Depression Mother   . Heart disease Father   . Hypertension Father   . Heart attack Father 68       heart attack, died  . Alcohol abuse Father   . Diabetes Maternal Grandmother   . Cancer Maternal Grandmother        colon  . Colon cancer Maternal Grandmother 16  . Anxiety disorder Maternal Grandmother   . Depression Maternal Grandmother   . Diabetes Maternal Grandfather   . Drug abuse Son   . Drug abuse Son   . Drug abuse Son     Social History   Socioeconomic History  . Marital status: Married    Spouse name: Not on file  . Number of children: Not on file  . Years of education: Not on file  . Highest education level: Not on  file  Occupational History  . Not on file  Social Needs  . Financial resource strain: Not on file  . Food insecurity:    Worry: Not on file    Inability: Not on file  . Transportation needs:    Medical: Not on file    Non-medical: Not on file  Tobacco Use  . Smoking status: Never Smoker  . Smokeless tobacco: Never Used  Substance and Sexual Activity  . Alcohol use: Yes    Alcohol/week: 0.0 standard drinks    Comment: Rare  . Drug use: No  . Sexual activity: Yes    Birth control/protection: Post-menopausal, Surgical, None    Comment: vasectomy-1st intercourse 45 yo-5 partners  Lifestyle  . Physical activity:    Days per week: Not on file    Minutes per session: Not on file  . Stress: Not on file  Relationships  . Social connections:    Talks on phone: Not on file    Gets together: Not on file    Attends religious service: Not on file    Active member of club or organization: Not on file    Attends meetings of clubs or organizations: Not on file    Relationship status: Not on file  . Intimate partner violence:    Fear of current or ex partner: Not on file    Emotionally abused: Not on file    Physically abused: Not on file    Forced sexual activity: Not on file  Other Topics Concern  . Not on file  Social History Narrative  . Not on file    Past Medical History, Surgical history, Social history, and Family history were reviewed and updated as appropriate.   Please see review of systems for further details on the patient's review from today.   Objective:   Physical Exam:  LMP 07/01/2002   Physical Exam Constitutional:      General: She is not in acute distress.    Appearance: She is well-developed.  Musculoskeletal:        General: No deformity.  Neurological:     Mental Status: She is alert and oriented to person, place, and time.     Coordination: Coordination normal.  Psychiatric:        Mood and Affect: Mood is not anxious or depressed. Affect is not  labile, blunt, angry or inappropriate.        Speech: Speech normal.        Behavior: Behavior normal.        Thought Content: Thought  content normal. Thought content does not include homicidal or suicidal ideation. Thought content does not include homicidal or suicidal plan.        Judgment: Judgment normal.     Comments: Insight intact. No auditory or visual hallucinations. No delusions.      Lab Review:     Component Value Date/Time   NA 139 02/11/2017 0838   K 4.5 02/11/2017 0838   CL 102 02/11/2017 0838   CO2 28 02/11/2017 0838   GLUCOSE 89 02/11/2017 0838   BUN 22 02/11/2017 0838   CREATININE 0.97 02/11/2017 0838   CALCIUM 9.5 02/11/2017 0838   PROT 6.6 02/11/2017 0838   ALBUMIN 4.4 02/11/2017 0838   AST 19 02/11/2017 0838   ALT 18 02/11/2017 0838   ALKPHOS 68 02/11/2017 0838   BILITOT 0.4 02/11/2017 0838       Component Value Date/Time   WBC 6.3 02/03/2015 0943   RBC 4.22 02/03/2015 0943   HGB 13.1 02/03/2015 0943   HCT 38.5 02/03/2015 0943   PLT 281 02/03/2015 0943   MCV 91.2 02/03/2015 0943   MCH 31.0 02/03/2015 0943   MCHC 34.0 02/03/2015 0943   RDW 13.5 02/03/2015 0943   LYMPHSABS 1.4 02/03/2015 0943   MONOABS 0.4 02/03/2015 0943   EOSABS 0.1 02/03/2015 0943   BASOSABS 0.1 02/03/2015 0943    No results found for: POCLITH, LITHIUM   No results found for: PHENYTOIN, PHENOBARB, VALPROATE, CBMZ   .res Assessment: Plan:   Continue Ritalin 20 mg po q am and 1/2 tab po q am for ADD. Continue Cymbalta 60 mg po q am for depression. Continue Wellbutrin XL 300 mg po q am for depression. Continue Amitriptyline 10 mg four tabs po QHS for insomnia and depression. Continue Perphenazine 2 mg po QHS for insomnia and depression. Continue Ambien 15 mg po QHS for insomnia.  Pt to f/u in 3 months or sooner if clinically indicated.  Patient advised to contact office with any questions, adverse effects, or acute worsening in signs and symptoms.  Attention deficit  hyperactivity disorder (ADHD), predominantly inattentive type - Plan: methylphenidate (RITALIN) 20 MG tablet, methylphenidate (RITALIN) 20 MG tablet, methylphenidate (RITALIN) 20 MG tablet  Recurrent major depressive disorder, in full remission ([place]) - Plan: amitriptyline (ELAVIL) 10 MG tablet, buPROPion (WELLBUTRIN XL) 300 MG 24 hr tablet, DULoxetine (CYMBALTA) 60 MG capsule, perphenazine (TRILAFON) 2 MG tablet  Insomnia, unspecified type - Plan: amitriptyline (ELAVIL) 10 MG tablet, zolpidem (AMBIEN) 10 MG tablet  Please see After Visit Summary for patient specific instructions.  Future Appointments  Date Time Provider [place]  03/04/2019  4:30 PM Thayer Headings, PMHNP [facility] None    No orders of the defined types were placed in this encounter.     -------------------------------

## 2018-12-16 ENCOUNTER — Encounter: Payer: Self-pay | Admitting: Gynecology

## 2019-02-22 ENCOUNTER — Other Ambulatory Visit: Payer: Self-pay | Admitting: Physical Medicine and Rehabilitation

## 2019-02-22 DIAGNOSIS — M5416 Radiculopathy, lumbar region: Secondary | ICD-10-CM

## 2019-02-25 ENCOUNTER — Other Ambulatory Visit: Payer: Self-pay | Admitting: Physical Medicine and Rehabilitation

## 2019-02-25 DIAGNOSIS — M5416 Radiculopathy, lumbar region: Secondary | ICD-10-CM

## 2019-03-04 ENCOUNTER — Ambulatory Visit (INDEPENDENT_AMBULATORY_CARE_PROVIDER_SITE_OTHER): Payer: Commercial Managed Care - PPO | Admitting: Psychiatry

## 2019-03-04 ENCOUNTER — Other Ambulatory Visit: Payer: Self-pay

## 2019-03-04 ENCOUNTER — Encounter: Payer: Self-pay | Admitting: Psychiatry

## 2019-03-04 DIAGNOSIS — F900 Attention-deficit hyperactivity disorder, predominantly inattentive type: Secondary | ICD-10-CM

## 2019-03-04 DIAGNOSIS — F3342 Major depressive disorder, recurrent, in full remission: Secondary | ICD-10-CM

## 2019-03-04 DIAGNOSIS — G4700 Insomnia, unspecified: Secondary | ICD-10-CM | POA: Diagnosis not present

## 2019-03-04 MED ORDER — BUPROPION HCL ER (XL) 300 MG PO TB24: 300.0000 mg | ORAL_TABLET | ORAL | 1 refills | Status: DC

## 2019-03-04 MED ORDER — METHYLPHENIDATE HCL 20 MG PO TABS: ORAL_TABLET | ORAL | 0 refills | Status: DC

## 2019-03-04 MED ORDER — PERPHENAZINE 2 MG PO TABS: 2.0000 mg | ORAL_TABLET | Freq: Every day | ORAL | 1 refills | Status: DC

## 2019-03-04 MED ORDER — AMITRIPTYLINE HCL 10 MG PO TABS: 40.0000 mg | ORAL_TABLET | Freq: Every day | ORAL | 1 refills | Status: DC

## 2019-03-04 MED ORDER — DULOXETINE HCL 30 MG PO CPEP: ORAL_CAPSULE | ORAL | 1 refills | Status: DC

## 2019-03-04 MED ORDER — DULOXETINE HCL 60 MG PO CPEP: 60.0000 mg | ORAL_CAPSULE | ORAL | 1 refills | Status: DC

## 2019-03-04 NOTE — Progress Notes (Signed)
Jeanne [name] [id number] 58 y.o.  Subjective:   Patient ID:  Jeanne [name] is a 58 y.o. (DOB [birth year]) female.  Chief Complaint:  Chief Complaint  Patient presents with  . Depression  . Follow-up    Anxiety, insomnia, ADD    HPI [name] presents to the office today for follow-up of depression, anxiety, insomnia, and ADD. Reports some residual depressive s/s. She reports that mood was initially improved with Cymbalta and has returned to low level depression. She reports that she had some anxiety with returning to school a few weeks ago. She reports adequate sleep and tends to go to bed around 7:30 pm. Appetite has been good. Energy and motivation have been ok. She reports that she is always able to do what she needs to do. Concentration has been adequate. Denies SI.   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   She reports that her surgery went well.    Review of Systems:  Review of Systems  Musculoskeletal: Negative for gait problem.       Pain in right leg  Neurological: Negative for tremors.  Psychiatric/Behavioral:       Please refer to HPI    Medications: I have reviewed the patient's current medications.  Current Outpatient Medications  Medication Sig Dispense Refill  . atorvastatin (LIPITOR) 20 MG tablet Take 10 mg by mouth daily.     . Calcium Carbonate-Vitamin D (CALCIUM + D PO) Take by mouth.    . gabapentin (NEURONTIN) 800 MG tablet Take 800 mg by mouth 3 (three) times daily.    Derrill Memo ON 03/29/2019] methylphenidate (RITALIN) 20 MG tablet Take 1 tab po q am and 1/2 po q noon 45 tablet 0  . [START ON 04/26/2019] methylphenidate (RITALIN) 20 MG tablet Take 1 tab po q am and 1/2 po q noon 45 tablet 0  . [START ON 05/24/2019] methylphenidate (RITALIN) 20 MG tablet 1 tab qam & 1/2 tab qnoon 45 tablet 0  . omeprazole (PRILOSEC) 20 MG capsule Take 20 mg by mouth daily as needed.     Marland Kitchen perphenazine (TRILAFON) 2 MG tablet Take 1 tablet (2 mg total)  by mouth at bedtime. 90 tablet 1  . polyethylene glycol powder (MIRALAX) powder Take 17 g by mouth daily.    Marland Kitchen zolpidem (AMBIEN) 10 MG tablet Take 1.5 tablets (15 mg total) by mouth at bedtime. 135 tablet 1  . amitriptyline (ELAVIL) 10 MG tablet Take 4 tablets (40 mg total) by mouth at bedtime. For 10 mg 360 tablet 1  . buPROPion (WELLBUTRIN XL) 300 MG 24 hr tablet Take 1 tablet (300 mg total) by mouth every morning. 90 tablet 1  . DULoxetine (CYMBALTA) 30 MG capsule Take 1 capsule with a 60 mg capsule to equal total dose of 90 mg 90 capsule 1  . DULoxetine (CYMBALTA) 60 MG capsule Take 1 capsule (60 mg total) by mouth every morning. 90 capsule 1  . valACYclovir (VALTREX) 500 MG tablet Take 1 tablet (500 mg total) by mouth 2 (two) times daily. (Patient taking differently: Take 500 mg by mouth 2 (two) times daily as needed. ) 20 tablet 1   No current facility-administered medications for this visit.     Medication Side Effects: Other: Dry mouth  Allergies:  Allergies  Allergen Reactions  . Lamictal [Lamotrigine] Itching    Past Medical History:  Diagnosis Date  . Acid reflux   . Anxiety   . Back pain   .  Depression   . Elevated cholesterol   . HSV (herpes simplex virus) anogenital infection   . Hx gestational diabetes   . Hyperprolactinemia ([place])    valued 78 with normal MRI 2012    Family History  Problem Relation Age of Onset  . Hypertension Mother   . Depression Mother   . Heart disease Father   . Hypertension Father   . Heart attack Father 22       heart attack, died  . Alcohol abuse Father   . Diabetes Maternal Grandmother   . Cancer Maternal Grandmother        colon  . Colon cancer Maternal Grandmother 56  . Anxiety disorder Maternal Grandmother   . Depression Maternal Grandmother   . Diabetes Maternal Grandfather   . Drug abuse Son   . Drug abuse Son   . Drug abuse Son     Social History   Socioeconomic History  . Marital status: Married    Spouse name:  Not on file  . Number of children: Not on file  . Years of education: Not on file  . Highest education level: Not on file  Occupational History  . Not on file  Social Needs  . Financial resource strain: Not on file  . Food insecurity    Worry: Not on file    Inability: Not on file  . Transportation needs    Medical: Not on file    Non-medical: Not on file  Tobacco Use  . Smoking status: Never Smoker  . Smokeless tobacco: Never Used  Substance and Sexual Activity  . Alcohol use: Yes    Alcohol/week: 0.0 standard drinks    Comment: Rare  . Drug use: No  . Sexual activity: Yes    Birth control/protection: Post-menopausal, Surgical, None    Comment: vasectomy-1st intercourse 23 yo-5 partners  Lifestyle  . Physical activity    Days per week: Not on file    Minutes per session: Not on file  . Stress: Not on file  Relationships  . Social Herbalist on phone: Not on file    Gets together: Not on file    Attends religious service: Not on file    Active member of club or organization: Not on file    Attends meetings of clubs or organizations: Not on file    Relationship status: Not on file  . Intimate partner violence    Fear of current or ex partner: Not on file    Emotionally abused: Not on file    Physically abused: Not on file    Forced sexual activity: Not on file  Other Topics Concern  . Not on file  Social History Narrative  . Not on file    Past Medical History, Surgical history, Social history, and Family history were reviewed and updated as appropriate.   Please see review of systems for further details on the patient's review from today.   Objective:   Physical Exam:  BP 103/86   Pulse 88   LMP 07/01/2002   Physical Exam Constitutional:      General: She is not in acute distress.    Appearance: She is well-developed.  Musculoskeletal:        General: No deformity.  Neurological:     Mental Status: She is alert and oriented to person,  place, and time.     Coordination: Coordination normal.  Psychiatric:        Attention and Perception: Attention and  perception normal. She does not perceive auditory or visual hallucinations.        Mood and Affect: Mood is depressed. Mood is not anxious. Affect is blunt. Affect is not labile, angry or inappropriate.        Speech: Speech normal.        Behavior: Behavior normal.        Thought Content: Thought content normal. Thought content does not include homicidal or suicidal ideation. Thought content does not include homicidal or suicidal plan.        Cognition and Memory: Cognition and memory normal.        Judgment: Judgment normal.     Comments: Insight intact. No delusions.      Lab Review:     Component Value Date/Time   NA 139 02/11/2017 0838   K 4.5 02/11/2017 0838   CL 102 02/11/2017 0838   CO2 28 02/11/2017 0838   GLUCOSE 89 02/11/2017 0838   BUN 22 02/11/2017 0838   CREATININE 0.97 02/11/2017 0838   CALCIUM 9.5 02/11/2017 0838   PROT 6.6 02/11/2017 0838   ALBUMIN 4.4 02/11/2017 0838   AST 19 02/11/2017 0838   ALT 18 02/11/2017 0838   ALKPHOS 68 02/11/2017 0838   BILITOT 0.4 02/11/2017 0838       Component Value Date/Time   WBC 6.3 02/03/2015 0943   RBC 4.22 02/03/2015 0943   HGB 13.1 02/03/2015 0943   HCT 38.5 02/03/2015 0943   PLT 281 02/03/2015 0943   MCV 91.2 02/03/2015 0943   MCH 31.0 02/03/2015 0943   MCHC 34.0 02/03/2015 0943   RDW 13.5 02/03/2015 0943   LYMPHSABS 1.4 02/03/2015 0943   MONOABS 0.4 02/03/2015 0943   EOSABS 0.1 02/03/2015 0943   BASOSABS 0.1 02/03/2015 0943    No results found for: POCLITH, LITHIUM   No results found for: PHENYTOIN, PHENOBARB, VALPROATE, CBMZ   .res Assessment: Plan:   Discussed potential benefits, risks, and side effects of increasing Cymbalta to 90 mg daily to improve depression.  Discussed that doses above 60 mg have not been approved by the FDA.  Patient agrees to trial of Cymbalta 90 mg daily. Will  continue all other medications as prescribed. Patient to follow-up in 3 months or sooner if clinically indicated. Patient advised to contact office with any questions, adverse effects, or acute worsening in signs and symptoms.  Albree was seen today for depression and follow-up.  Diagnoses and all orders for this visit:  Recurrent major depressive disorder, in full remission ([place]) -     DULoxetine (CYMBALTA) 30 MG capsule; Take 1 capsule with a 60 mg capsule to equal total dose of 90 mg -     DULoxetine (CYMBALTA) 60 MG capsule; Take 1 capsule (60 mg total) by mouth every morning. -     amitriptyline (ELAVIL) 10 MG tablet; Take 4 tablets (40 mg total) by mouth at bedtime. For 10 mg -     buPROPion (WELLBUTRIN XL) 300 MG 24 hr tablet; Take 1 tablet (300 mg total) by mouth every morning. -     perphenazine (TRILAFON) 2 MG tablet; Take 1 tablet (2 mg total) by mouth at bedtime.  Insomnia, unspecified type -     amitriptyline (ELAVIL) 10 MG tablet; Take 4 tablets (40 mg total) by mouth at bedtime. For 10 mg  Attention deficit hyperactivity disorder (ADHD), predominantly inattentive type -     methylphenidate (RITALIN) 20 MG tablet; Take 1 tab po q am and 1/2 po  q noon -     methylphenidate (RITALIN) 20 MG tablet; Take 1 tab po q am and 1/2 po q noon -     methylphenidate (RITALIN) 20 MG tablet; 1 tab qam & 1/2 tab qnoon     Please see After Visit Summary for patient specific instructions.  Future Appointments  Date Time Provider [place]  03/20/2019  7:00 AM [facility]-315 MR 2 [id number] [facility] [address] [facility]  04/23/2019  8:30 AM Fontaine, Belinda Block, MD [facility]  06/01/2019  5:00 PM Thayer Headings, PMHNP CP-CP None    No orders of the defined types were placed in this encounter.   -------------------------------

## 2019-03-20 ENCOUNTER — Ambulatory Visit
Admission: RE | Admit: 2019-03-20 | Discharge: 2019-03-20 | Disposition: A | Discharge status: Home / Self Care | Payer: Commercial Managed Care - PPO | Source: Ambulatory Visit | Attending: Physical Medicine and Rehabilitation | Admitting: Physical Medicine and Rehabilitation

## 2019-03-20 ENCOUNTER — Other Ambulatory Visit: Payer: Self-pay

## 2019-03-20 DIAGNOSIS — M5416 Radiculopathy, lumbar region: Secondary | ICD-10-CM

## 2019-03-30 ENCOUNTER — Encounter: Payer: Self-pay | Admitting: Gynecology

## 2019-04-21 ENCOUNTER — Other Ambulatory Visit: Payer: Self-pay | Admitting: Physical Medicine and Rehabilitation

## 2019-04-21 DIAGNOSIS — M1611 Unilateral primary osteoarthritis, right hip: Secondary | ICD-10-CM

## 2019-04-22 ENCOUNTER — Other Ambulatory Visit: Payer: Self-pay

## 2019-04-23 ENCOUNTER — Encounter: Payer: Self-pay | Admitting: Gynecology

## 2019-04-23 ENCOUNTER — Ambulatory Visit (INDEPENDENT_AMBULATORY_CARE_PROVIDER_SITE_OTHER): Payer: Commercial Managed Care - PPO | Admitting: Gynecology

## 2019-04-23 VITALS — BP 134/80 | Ht 62.0 in | Wt 132.0 lb

## 2019-04-23 DIAGNOSIS — Z01419 Encounter for gynecological examination (general) (routine) without abnormal findings: Secondary | ICD-10-CM

## 2019-04-23 DIAGNOSIS — N952 Postmenopausal atrophic vaginitis: Secondary | ICD-10-CM

## 2019-04-23 DIAGNOSIS — Z1151 Encounter for screening for human papillomavirus (HPV): Secondary | ICD-10-CM | POA: Diagnosis not present

## 2019-04-23 NOTE — Progress Notes (Signed)
    Jeanne [name] [id number]        58 y.o.  [id number] for annual gynecologic exam.  Without gynecologic complaints  Past medical history,surgical history, problem list, medications, allergies, family history and social history were all reviewed and documented as reviewed in the EPIC chart.  ROS:  Performed with pertinent positives and negatives included in the history, assessment and plan.   Additional significant findings : None   Exam: Jeanne [name] assistant Vitals:   04/23/19 0825  BP: 134/80  Weight: 132 lb (59.9 kg)  Height: 5\' 2"  (1.575 m)   Body mass index is 24.14 kg/m.  General appearance:  Normal affect, orientation and appearance. Skin: Grossly normal HEENT: Without gross lesions.  No cervical or supraclavicular adenopathy. Thyroid normal.  Lungs:  Clear without wheezing, rales or rhonchi Cardiac: RR, without RMG Abdominal:  Soft, nontender, without masses, guarding, rebound, organomegaly or hernia Breasts:  Examined lying and sitting without masses, retractions, discharge or axillary adenopathy. Pelvic:  Ext, BUS, Vagina: With atrophic changes  Cervix: With atrophic changes  Uterus: Anteverted, normal size, shape and contour, midline and mobile nontender   Adnexa: Without masses or tenderness    Anus and perineum: Normal   Rectovaginal: Normal sphincter tone without palpated masses or tenderness.    Assessment/Plan:  58 y.o. [id number] female for annual gynecologic exam.   1. Postmenopausal.  No significant menopausal symptoms or any vaginal bleeding. 2. History of hyperprolactinemia.  Normal MRI in the past.  Normal follow-up prolactin's.  We agreed to stop screening at this point.  She is without galactorrhea headaches or visual changes. 3. Pap smear/HPV 2015.  Pap smear/HPV today.  No history of significant abnormal Pap smears. 4. Mammography 11/2018.  Continue with annual mammography when due.  Breast exam normal today. 5. DEXA 2013 normal.  Recommend  follow-up DEXA now at age 63 and patient will schedule. 6. Colonoscopy 2013.  Repeat at their recommended interval. 7. History of rare HSV outbreaks.  Uses Valtrex 500 mg twice daily for several days as needed.  #20 with 1 refill provided. 8. Health maintenance.  No routine lab work done as patient does this elsewhere.  Follow-up 1 year, sooner as needed.   Jeanne Auerbach MD, 8:47 AM 04/23/2019

## 2019-04-23 NOTE — Patient Instructions (Signed)
Follow-up for the bone density as scheduled. 

## 2019-04-23 NOTE — Addendum Note (Signed)
Addended by: Nelva Nay on: 04/23/2019 09:00 AM   Modules accepted: Orders

## 2019-04-27 LAB — PAP IG AND HPV HIGH-RISK: HPV DNA High Risk: NOT DETECTED

## 2019-05-08 ENCOUNTER — Ambulatory Visit
Admission: RE | Admit: 2019-05-08 | Discharge: 2019-05-08 | Disposition: A | Discharge status: Home / Self Care | Payer: Commercial Managed Care - PPO | Source: Ambulatory Visit | Attending: Physical Medicine and Rehabilitation | Admitting: Physical Medicine and Rehabilitation

## 2019-05-08 DIAGNOSIS — M1611 Unilateral primary osteoarthritis, right hip: Secondary | ICD-10-CM

## 2019-05-24 ENCOUNTER — Ambulatory Visit: Payer: Commercial Managed Care - PPO | Admitting: Orthopaedic Surgery

## 2019-05-25 ENCOUNTER — Ambulatory Visit: Payer: Commercial Managed Care - PPO | Admitting: Orthopaedic Surgery

## 2019-05-26 ENCOUNTER — Ambulatory Visit: Payer: Self-pay

## 2019-05-26 ENCOUNTER — Other Ambulatory Visit: Payer: Self-pay

## 2019-05-26 ENCOUNTER — Encounter: Payer: Self-pay | Admitting: Orthopaedic Surgery

## 2019-05-26 ENCOUNTER — Ambulatory Visit (INDEPENDENT_AMBULATORY_CARE_PROVIDER_SITE_OTHER): Payer: Commercial Managed Care - PPO | Admitting: Orthopaedic Surgery

## 2019-05-26 DIAGNOSIS — M1611 Unilateral primary osteoarthritis, right hip: Secondary | ICD-10-CM | POA: Diagnosis not present

## 2019-05-26 DIAGNOSIS — M25551 Pain in right hip: Secondary | ICD-10-CM

## 2019-05-26 NOTE — Progress Notes (Signed)
Office Visit Note   Patient: Jeanne [name]           Date of Birth: 09-21-1960           MRN: [id number] Visit Date: 05/26/2019              Requested by: Jonathon Bellows, PA-C [address]  [place] [address] PCP: Jonathon Bellows, PA-C   Assessment & Plan: Visit Diagnoses:  1. Pain in right hip   2. Primary osteoarthritis of right hip     Plan: Due to the fact that patient is having severe right hip pain that is affecting her activities of daily living and given the findings on plain radiographs and MRI which show end-stage arthritic changes of the right hip recommend right total hip arthroplasty.  Patient would like to proceed with this in the near future.  Questions encouraged and answered at length.  Procedure postoperative protocol all reviewed with the patient.  Risk benefits of surgery discussed with patient.  Risk including but not limited to blood loss, DVT PE, leg length discrepancy wound healing problems all discussed with patient at length.  We will see her back 2 weeks postop.  Questions are encouraged and answered by Dr. Ninfa Linden myself.  Follow-Up Instructions: No follow-ups on file.   Orders:  Orders Placed This Encounter  Procedures  . XR HIP UNILAT W OR W/O PELVIS 2-3 VIEWS RIGHT   No orders of the defined types were placed in this encounter.     Procedures: No procedures performed   Clinical Data: No additional findings.   Subjective: Chief Complaint  Patient presents with  . Right Hip - Pain    HPI Jeanne [name] is a 58 year old female comes in today with right hip pain.  She has had right groin pain since July 2020 no known injury.  Pain radiates into her thigh and knee.  States she cannot walk more than 30 yards without having to stop.  She does occasionally use a walker or cane due to the pain.  She has tried Tylenol for the pain.  She has difficulty donning shoes and socks.  She denies any numbness tingling down  the leg. MRI dated 05/08/2019 showed severe advanced osteoarthritis of the right hip.  Large joint effusion with severe synovitis and loose bodies.  Mild osteoarthritis of the left hip was also noted.  Small partial tear of the right gluteus minimus and a partial tear of the hamstring bilaterally noted. Review of Systems Please see HPI otherwise negative or noncontributory.  Denies any fevers chills shortness breath chest pain.  Objective: Vital Signs: LMP 07/01/2002   Physical Exam Constitutional:      Appearance: She is normal weight. She is not ill-appearing or diaphoretic.  Pulmonary:     Effort: Pulmonary effort is normal.  Neurological:     Mental Status: She is alert and oriented to person, place, and time.  Psychiatric:        Mood and Affect: Mood normal.     Ortho Exam Bilateral hips she has good range of motion left hip without pain.  She has limited external rotation of the right hip and no internal rotation of the right hip.  Attempts of internal rotation causes pain. Specialty Comments:  No specialty comments available.  Imaging: Xr Hip Unilat W Or W/o Pelvis 2-3 Views Right  Result Date: 05/26/2019 AP pelvis lateral view of the right hip: End-stage arthritic changes right hip.  Loose bodies within the hip joint.  No acute fracture.  Cystic changes within the femoral head.    PMFS History: Patient Active Problem List   Diagnosis Date Noted  . Major depressive disorder, recurrent episode, mild ([place]) 05/09/2018  . Insomnia 05/09/2018  . Depression 12/04/2011  . Anxiety 12/04/2011  . Hyperprolactinemia ([place])    Past Medical History:  Diagnosis Date  . Acid reflux   . Anxiety   . Back pain   . Depression   . Elevated cholesterol   . HSV (herpes simplex virus) anogenital infection   . Hx gestational diabetes   . Hyperprolactinemia ([place])    valued 69 with normal MRI 2012    Family History  Problem Relation Age of Onset  . Hypertension Mother   .  Depression Mother   . Heart disease Father   . Hypertension Father   . Heart attack Father 43       heart attack, died  . Alcohol abuse Father   . Diabetes Maternal Grandmother   . Cancer Maternal Grandmother        colon  . Colon cancer Maternal Grandmother 19  . Anxiety disorder Maternal Grandmother   . Depression Maternal Grandmother   . Diabetes Maternal Grandfather   . Drug abuse Son   . Drug abuse Son   . Drug abuse Son     Past Surgical History:  Procedure Laterality Date  . BREAST SURGERY  12/2013   Reduction  . BUNIONECTOMY    . CERVICAL DISC SURGERY    . right elbow surgery    . right foot surgery     Social History   Occupational History  . Not on file  Tobacco Use  . Smoking status: Never Smoker  . Smokeless tobacco: Never Used  Substance and Sexual Activity  . Alcohol use: Yes    Alcohol/week: 0.0 standard drinks    Comment: Rare  . Drug use: No  . Sexual activity: Yes    Birth control/protection: Post-menopausal, Surgical    Comment: vasectomy-1st intercourse 49 yo-5 partners

## 2019-06-01 ENCOUNTER — Encounter: Payer: Self-pay | Admitting: Psychiatry

## 2019-06-01 ENCOUNTER — Other Ambulatory Visit: Payer: Self-pay

## 2019-06-01 ENCOUNTER — Ambulatory Visit (INDEPENDENT_AMBULATORY_CARE_PROVIDER_SITE_OTHER): Payer: Commercial Managed Care - PPO | Admitting: Psychiatry

## 2019-06-01 DIAGNOSIS — F3342 Major depressive disorder, recurrent, in full remission: Secondary | ICD-10-CM | POA: Diagnosis not present

## 2019-06-01 DIAGNOSIS — G4700 Insomnia, unspecified: Secondary | ICD-10-CM

## 2019-06-01 DIAGNOSIS — M8580 Other specified disorders of bone density and structure, unspecified site: Secondary | ICD-10-CM

## 2019-06-01 DIAGNOSIS — F900 Attention-deficit hyperactivity disorder, predominantly inattentive type: Secondary | ICD-10-CM | POA: Diagnosis not present

## 2019-06-01 HISTORY — DX: Other specified disorders of bone density and structure, unspecified site: M85.80

## 2019-06-01 MED ORDER — ZOLPIDEM TARTRATE 10 MG PO TABS: 15.0000 mg | ORAL_TABLET | Freq: Every day | ORAL | 1 refills | Status: DC

## 2019-06-01 MED ORDER — DULOXETINE HCL 60 MG PO CPEP: 60.0000 mg | ORAL_CAPSULE | ORAL | 1 refills | Status: DC

## 2019-06-01 MED ORDER — DULOXETINE HCL 30 MG PO CPEP: ORAL_CAPSULE | ORAL | 1 refills | Status: DC

## 2019-06-01 MED ORDER — PERPHENAZINE 2 MG PO TABS: 2.0000 mg | ORAL_TABLET | Freq: Every day | ORAL | 1 refills | Status: DC

## 2019-06-01 MED ORDER — BUPROPION HCL ER (XL) 300 MG PO TB24: 300.0000 mg | ORAL_TABLET | ORAL | 1 refills | Status: DC

## 2019-06-01 MED ORDER — METHYLPHENIDATE HCL 20 MG PO TABS: ORAL_TABLET | ORAL | 0 refills | Status: DC

## 2019-06-01 MED ORDER — AMITRIPTYLINE HCL 10 MG PO TABS: 40.0000 mg | ORAL_TABLET | Freq: Every day | ORAL | 1 refills | Status: DC

## 2019-06-01 NOTE — Progress Notes (Signed)
   06/01/19 1705  Facial and Oral Movements  Muscles of Facial Expression 0  Lips and Perioral Area 0  Jaw 0  Tongue 0  Extremity Movements  Upper (arms, wrists, hands, fingers) 0  Lower (legs, knees, ankles, toes) 0  Trunk Movements  Neck, shoulders, hips 0  Overall Severity  Severity of abnormal movements (highest score from questions above) 0  Incapacitation due to abnormal movements 0  Patient's awareness of abnormal movements (rate only patient's report) 0  AIMS Total Score  AIMS Total Score 0

## 2019-06-01 NOTE — Progress Notes (Signed)
Jeanne [name] [id number] 58 y.o.  Subjective:   Patient ID:  Jeanne [name] is a 58 y.o. (DOB Jul 15, 1960) female.  Chief Complaint:  Chief Complaint  Patient presents with  . Follow-up    Depression, Anxiety, and ADD    HPI [name] presents to the office today for follow-up of depression, anxiety, ADD, and insomnia. She reports improved mood- "a lot less burdened." She reports that she continues to have some residual depressive s/s. Denies anxiety other than some worry about her son having some marital stress. Sleeping well. Appetite has been stable. Energy and motivation have improved. Concentration has been adequate. Denies SI.   Has hip replacement scheduled for January 8th.   Past medication trials: Prozac- took for approximately 30 to 40 years Zoloft Wellbutrin Amitriptyline Perphenazine Methylphenidate Adderall Abilify Lamictal-itching Cymbalta Latuda Cerefolin  Review of Systems:  Review of Systems  Cardiovascular: Negative for palpitations.  Musculoskeletal: Negative for gait problem.       Hip pain  Neurological: Negative for tremors.  Psychiatric/Behavioral:       Please refer to HPI    Medications: I have reviewed the patient's current medications.  Current Outpatient Medications  Medication Sig Dispense Refill  . acetaminophen (TYLENOL) 325 MG tablet Take 650 mg by mouth every 6 (six) hours as needed.    Marland Kitchen amitriptyline (ELAVIL) 10 MG tablet Take 4 tablets (40 mg total) by mouth at bedtime. For 10 mg 360 tablet 1  . atorvastatin (LIPITOR) 20 MG tablet Take 10 mg by mouth daily.     Marland Kitchen buPROPion (WELLBUTRIN XL) 300 MG 24 hr tablet Take 1 tablet (300 mg total) by mouth every morning. 90 tablet 1  . Calcium Carbonate-Vitamin D (CALCIUM + D PO) Take by mouth.    . DULoxetine (CYMBALTA) 30 MG capsule Take 1 capsule with a 60 mg capsule to equal total dose of 90 mg 90 capsule 1  . DULoxetine (CYMBALTA) 60 MG capsule Take 1 capsule (60 mg total)  by mouth every morning. 90 capsule 1  . gabapentin (NEURONTIN) 800 MG tablet Take 800 mg by mouth 3 (three) times daily.    . methylphenidate (RITALIN) 20 MG tablet Take 1 tablet (20 mg total) by mouth every morning AND 0.5 tablets (10 mg total) daily at 12 noon. 135 tablet 0  . omeprazole (PRILOSEC) 20 MG capsule Take 20 mg by mouth daily as needed.     Marland Kitchen perphenazine (TRILAFON) 2 MG tablet Take 1 tablet (2 mg total) by mouth at bedtime. 90 tablet 1  . polyethylene glycol powder (MIRALAX) powder Take 17 g by mouth daily.    . valACYclovir (VALTREX) 500 MG tablet Take 1 tablet (500 mg total) by mouth 2 (two) times daily. (Patient taking differently: Take 500 mg by mouth 2 (two) times daily as needed. ) 20 tablet 1  . [START ON 07/27/2019] zolpidem (AMBIEN) 10 MG tablet Take 1.5 tablets (15 mg total) by mouth at bedtime. 135 tablet 1   No current facility-administered medications for this visit.     Medication Side Effects: Other: Dry mouth  Allergies:  Allergies  Allergen Reactions  . Lamictal [Lamotrigine] Itching    Past Medical History:  Diagnosis Date  . Acid reflux   . Anxiety   . Back pain   . Depression   . Elevated cholesterol   . HSV (herpes simplex virus) anogenital infection   . Hx gestational diabetes   . Hyperprolactinemia ([place])    valued 44  with normal MRI 2012    Family History  Problem Relation Age of Onset  . Hypertension Mother   . Depression Mother   . Heart disease Father   . Hypertension Father   . Heart attack Father 77       heart attack, died  . Alcohol abuse Father   . Diabetes Maternal Grandmother   . Cancer Maternal Grandmother        colon  . Colon cancer Maternal Grandmother 69  . Anxiety disorder Maternal Grandmother   . Depression Maternal Grandmother   . Diabetes Maternal Grandfather   . Drug abuse Son   . Drug abuse Son   . Drug abuse Son     Social History   Socioeconomic History  . Marital status: Married    Spouse name: Not  on file  . Number of children: Not on file  . Years of education: Not on file  . Highest education level: Not on file  Occupational History  . Not on file  Social Needs  . Financial resource strain: Not on file  . Food insecurity    Worry: Not on file    Inability: Not on file  . Transportation needs    Medical: Not on file    Non-medical: Not on file  Tobacco Use  . Smoking status: Never Smoker  . Smokeless tobacco: Never Used  Substance and Sexual Activity  . Alcohol use: Yes    Alcohol/week: 0.0 standard drinks    Comment: Rare  . Drug use: No  . Sexual activity: Yes    Birth control/protection: Post-menopausal, Surgical    Comment: vasectomy-1st intercourse 32 yo-5 partners  Lifestyle  . Physical activity    Days per week: Not on file    Minutes per session: Not on file  . Stress: Not on file  Relationships  . Social Herbalist on phone: Not on file    Gets together: Not on file    Attends religious service: Not on file    Active member of club or organization: Not on file    Attends meetings of clubs or organizations: Not on file    Relationship status: Not on file  . Intimate partner violence    Fear of current or ex partner: Not on file    Emotionally abused: Not on file    Physically abused: Not on file    Forced sexual activity: Not on file  Other Topics Concern  . Not on file  Social History Narrative  . Not on file    Past Medical History, Surgical history, Social history, and Family history were reviewed and updated as appropriate.   Please see review of systems for further details on the patient's review from today.   Objective:   Physical Exam:  BP 127/82   Pulse 90   LMP 07/01/2002   Physical Exam Constitutional:      General: She is not in acute distress.    Appearance: She is well-developed.  Musculoskeletal:        General: No deformity.  Neurological:     Mental Status: She is alert and oriented to person, place, and  time.     Coordination: Coordination normal.  Psychiatric:        Attention and Perception: Attention and perception normal. She does not perceive auditory or visual hallucinations.        Mood and Affect: Mood normal. Mood is not anxious or depressed. Affect is not labile,  blunt, angry or inappropriate.        Speech: Speech normal.        Behavior: Behavior normal.        Thought Content: Thought content normal. Thought content does not include homicidal or suicidal ideation. Thought content does not include homicidal or suicidal plan.        Cognition and Memory: Cognition and memory normal.        Judgment: Judgment normal.     Comments: Insight intact. No delusions.      Lab Review:     Component Value Date/Time   NA 139 02/11/2017 0838   K 4.5 02/11/2017 0838   CL 102 02/11/2017 0838   CO2 28 02/11/2017 0838   GLUCOSE 89 02/11/2017 0838   BUN 22 02/11/2017 0838   CREATININE 0.97 02/11/2017 0838   CALCIUM 9.5 02/11/2017 0838   PROT 6.6 02/11/2017 0838   ALBUMIN 4.4 02/11/2017 0838   AST 19 02/11/2017 0838   ALT 18 02/11/2017 0838   ALKPHOS 68 02/11/2017 0838   BILITOT 0.4 02/11/2017 0838       Component Value Date/Time   WBC 6.3 02/03/2015 0943   RBC 4.22 02/03/2015 0943   HGB 13.1 02/03/2015 0943   HCT 38.5 02/03/2015 0943   PLT 281 02/03/2015 0943   MCV 91.2 02/03/2015 0943   MCH 31.0 02/03/2015 0943   MCHC 34.0 02/03/2015 0943   RDW 13.5 02/03/2015 0943   LYMPHSABS 1.4 02/03/2015 0943   MONOABS 0.4 02/03/2015 0943   EOSABS 0.1 02/03/2015 0943   BASOSABS 0.1 02/03/2015 0943    No results found for: POCLITH, LITHIUM   No results found for: PHENYTOIN, PHENOBARB, VALPROATE, CBMZ   .res Assessment: Plan:   Will continue current plan of care since target signs and symptoms are well controlled without any tolerability issues. Patient to follow-up in 3 months or sooner if clinically indicated. Patient advised to contact office with any questions, adverse  effects, or acute worsening in signs and symptoms.  Nykerria was seen today for follow-up.  Diagnoses and all orders for this visit:  Recurrent major depressive disorder, in full remission ([place]) -     amitriptyline (ELAVIL) 10 MG tablet; Take 4 tablets (40 mg total) by mouth at bedtime. For 10 mg -     buPROPion (WELLBUTRIN XL) 300 MG 24 hr tablet; Take 1 tablet (300 mg total) by mouth every morning. -     DULoxetine (CYMBALTA) 30 MG capsule; Take 1 capsule with a 60 mg capsule to equal total dose of 90 mg -     DULoxetine (CYMBALTA) 60 MG capsule; Take 1 capsule (60 mg total) by mouth every morning. -     perphenazine (TRILAFON) 2 MG tablet; Take 1 tablet (2 mg total) by mouth at bedtime.  Insomnia, unspecified type -     amitriptyline (ELAVIL) 10 MG tablet; Take 4 tablets (40 mg total) by mouth at bedtime. For 10 mg -     zolpidem (AMBIEN) 10 MG tablet; Take 1.5 tablets (15 mg total) by mouth at bedtime.  Attention deficit hyperactivity disorder (ADHD), predominantly inattentive type -     methylphenidate (RITALIN) 20 MG tablet; Take 1 tablet (20 mg total) by mouth every morning AND 0.5 tablets (10 mg total) daily at 12 noon.     Please see After Visit Summary for patient specific instructions.  Future Appointments  Date Time Provider [place]  06/22/2019  8:20 AM [facility] BONE DENSITY RM [facility]G None  No orders of the defined types were placed in this encounter.   -------------------------------

## 2019-06-02 ENCOUNTER — Ambulatory Visit: Payer: Commercial Managed Care - PPO | Admitting: Orthopaedic Surgery

## 2019-06-09 ENCOUNTER — Other Ambulatory Visit: Payer: Self-pay | Admitting: Psychiatry

## 2019-06-09 DIAGNOSIS — F3342 Major depressive disorder, recurrent, in full remission: Secondary | ICD-10-CM

## 2019-06-21 ENCOUNTER — Other Ambulatory Visit: Payer: Self-pay

## 2019-06-22 ENCOUNTER — Encounter: Payer: Self-pay | Admitting: Gynecology

## 2019-06-22 ENCOUNTER — Ambulatory Visit (INDEPENDENT_AMBULATORY_CARE_PROVIDER_SITE_OTHER): Payer: Commercial Managed Care - PPO

## 2019-06-22 ENCOUNTER — Other Ambulatory Visit: Payer: Self-pay | Admitting: Gynecology

## 2019-06-22 DIAGNOSIS — Z780 Asymptomatic menopausal state: Secondary | ICD-10-CM

## 2019-06-22 DIAGNOSIS — M8589 Other specified disorders of bone density and structure, multiple sites: Secondary | ICD-10-CM

## 2019-06-22 DIAGNOSIS — Z01419 Encounter for gynecological examination (general) (routine) without abnormal findings: Secondary | ICD-10-CM

## 2019-06-23 ENCOUNTER — Telehealth: Payer: Self-pay | Admitting: Orthopaedic Surgery

## 2019-06-23 NOTE — Telephone Encounter (Signed)
Patient aware this is ready for her at the front desk

## 2019-06-23 NOTE — Telephone Encounter (Signed)
Patient called requesting a handicap placard.  CB#[phone].  Thank you.

## 2019-06-30 NOTE — Patient Instructions (Addendum)
DUE TO COVID-19 ONLY ONE VISITOR IS ALLOWED TO COME WITH YOU AND STAY IN THE WAITING ROOM ONLY DURING PRE OP AND PROCEDURE DAY OF SURGERY. THE 1 VISITOR MAY VISIT WITH YOU AFTER SURGERY IN YOUR PRIVATE ROOM DURING VISITING HOURS ONLY!  YOU NEED TO HAVE A COVID 19 TEST ON__1/5_____ @__9 :30_____, THIS TEST MUST BE DONE BEFORE SURGERY, COME  [address], [place] , [address].  ([place]) ONCE YOUR COVID TEST IS COMPLETED, PLEASE BEGIN THE QUARANTINE INSTRUCTIONS AS OUTLINED IN YOUR HANDOUT.                Jeanne [name]    Your procedure is scheduled on: 07/09/19   Report to [facility] Main  Entrance   Report to admitting at 7:15 AM     Call this number if you have problems the morning of surgery [place], NO CHEWING GUM [place].  Do not eat food After Midnight  . YOU MAY HAVE CLEAR LIQUIDS FROM MIDNIGHT UNTIL 6:30 AM.   At 6:30 AM Please finish the prescribed Pre-Surgery  drink  . Nothing by mouth after you finish the  drink !    Take these medicines the morning of surgery with A SIP OF WATER: Gabapentin,wellbutrin,Cymbalta, Prilosec, use your eye drops                                 You may not have any metal on your body including hair pins and              piercings  Do not wear jewelry, make-up, lotions, powders or perfumes, deodorant             Do not wear nail polish on your fingernails.  Do not shave  48 hours prior to surgery.                 Do not bring valuables to the hospital. [place].  Contacts, dentures or bridgework may not be worn into surgery.      Patients discharged the day of surgery will not be allowed to drive home.   IF YOU ARE HAVING SURGERY AND GOING HOME THE SAME DAY, YOU MUST HAVE AN ADULT TO DRIVE YOU HOME AND BE WITH YOU FOR 24 HOURS.   YOU MAY GO HOME BY TAXI OR UBER OR ORTHERWISE, BUT  AN ADULT MUST ACCOMPANY YOU HOME AND STAY WITH YOU FOR 24 HOURS.  Name and phone number of your [place]:  Special Instructions: N/A              Please read over the following fact sheets you were given: _____________________________________________________________________             [facility] - Preparing for Surgery  Before surgery, you can play an important role .  Because skin is not sterile, your skin needs to be as free of germs as possible.   You can reduce the number of germs on your skin by washing with CHG (chlorahexidine gluconate) soap before surgery .  CHG is an antiseptic cleaner which kills germs and bonds with the skin to continue killing germs even after washing. Please DO NOT use if you have an allergy to  CHG or antibacterial soaps.   If your skin becomes reddened/irritated stop using the CHG and inform your nurse when you arrive at Short Stay. Do not shave (including legs and underarms) for at least 48 hours prior to the first CHG shower.   Please follow these instructions carefully:  1.  Shower with CHG Soap the night before surgery and the  morning of Surgery.  2.  If you choose to wash your hair, wash your hair first as usual with your  normal  shampoo.  3.  After you shampoo, rinse your hair and body thoroughly to remove the  shampoo.                                        4.  Use CHG as you would any other liquid soap.  You can apply chg directly  to the skin and wash                       Gently with a scrungie or clean washcloth.  5.  Apply the CHG Soap to your body ONLY FROM THE NECK DOWN.   Do not use on face/ open                           Wound or open sores. Avoid contact with eyes, ears mouth and genitals (private parts).                       Wash face,  Genitals (private parts) with your normal soap.             6.  Wash thoroughly, paying special attention to the area where your surgery  will be performed.  7.  Thoroughly rinse your body with warm  water from the neck down.  8.  DO NOT shower/wash with your normal soap after using and rinsing off  the CHG Soap.             9.  Pat yourself dry with a clean towel.            10.  Wear clean pajamas.            11.  Place clean sheets on your bed the night of your first shower and do not  sleep with pets. Day of Surgery : Do not apply any lotions/deodorants the morning of surgery.  Please wear clean clothes to the hospital/surgery center.  FAILURE TO FOLLOW THESE INSTRUCTIONS MAY RESULT IN THE CANCELLATION OF YOUR SURGERY PATIENT SIGNATURE_________________________________  NURSE SIGNATURE__________________________________  ________________________________________________________________________   Adam Phenix  An incentive spirometer is a tool that can help keep your lungs clear and active. This tool measures how well you are filling your lungs with each breath. Taking long deep breaths may help reverse or decrease the chance of developing breathing (pulmonary) problems (especially infection) following:  A long period of time when you are unable to move or be active. BEFORE THE PROCEDURE   If the spirometer includes an indicator to show your best effort, your nurse or respiratory therapist will set it to a desired goal.  If possible, sit up straight or lean slightly forward. Try not to slouch.  Hold the incentive spirometer in an upright position. INSTRUCTIONS FOR USE  1. Sit on the edge of your bed if possible, or  sit up as far as you can in bed or on a chair. 2. Hold the incentive spirometer in an upright position. 3. Breathe out normally. 4. Place the mouthpiece in your mouth and seal your lips tightly around it. 5. Breathe in slowly and as deeply as possible, raising the piston or the ball toward the top of the column. 6. Hold your breath for 3-5 seconds or for as long as possible. Allow the piston or ball to fall to the bottom of the column. 7. Remove the  mouthpiece from your mouth and breathe out normally. 8. Rest for a few seconds and repeat Steps 1 through 7 at least 10 times every 1-2 hours when you are awake. Take your time and take a few normal breaths between deep breaths. 9. The spirometer may include an indicator to show your best effort. Use the indicator as a goal to work toward during each repetition. 10. After each set of 10 deep breaths, practice coughing to be sure your lungs are clear. If you have an incision (the cut made at the time of surgery), support your incision when coughing by placing a pillow or rolled up towels firmly against it. Once you are able to get out of bed, walk around indoors and cough well. You may stop using the incentive spirometer when instructed by your caregiver.  RISKS AND COMPLICATIONS  Take your time so you do not get dizzy or light-headed.  If you are in pain, you may need to take or ask for pain medication before doing incentive spirometry. It is harder to take a deep breath if you are having pain. AFTER USE  Rest and breathe slowly and easily.  It can be helpful to keep track of a log of your progress. Your caregiver can provide you with a simple table to help with this. If you are using the spirometer at home, follow these instructions: [place] IF:   You are having difficultly using the spirometer.  You have trouble using the spirometer as often as instructed.  Your pain medication is not giving enough relief while using the spirometer.  You develop fever of 100.5 F (38.1 C) or higher. SEEK IMMEDIATE MEDICAL CARE IF:   You cough up bloody sputum that had not been present before.  You develop fever of 102 F (38.9 C) or greater.  You develop worsening pain at or near the incision site. MAKE SURE YOU:   Understand these instructions.  Will watch your condition.  Will get help right away if you are not doing well or get worse. Document Released: 10/28/2006 Document  Revised: 09/09/2011 Document Reviewed: 12/29/2006 [facility] Patient Information 2014 [place], [place].   ________________________________________________________________________

## 2019-07-05 ENCOUNTER — Encounter (HOSPITAL_COMMUNITY)
Admission: RE | Admit: 2019-07-05 | Discharge: 2019-07-05 | Disposition: A | Discharge status: Home / Self Care | Payer: Commercial Managed Care - PPO | Source: Ambulatory Visit | Attending: Orthopaedic Surgery | Admitting: Orthopaedic Surgery

## 2019-07-05 ENCOUNTER — Other Ambulatory Visit: Payer: Self-pay

## 2019-07-05 ENCOUNTER — Encounter (HOSPITAL_COMMUNITY): Payer: Self-pay

## 2019-07-05 DIAGNOSIS — Z01812 Encounter for preprocedural laboratory examination: Secondary | ICD-10-CM | POA: Diagnosis not present

## 2019-07-05 LAB — BASIC METABOLIC PANEL
Anion gap: 7 (ref 5–15)
BUN: 21 mg/dL — ABNORMAL HIGH (ref 6–20)
CO2: 30 mmol/L (ref 22–32)
Calcium: 9.1 mg/dL (ref 8.9–10.3)
Chloride: 97 mmol/L — ABNORMAL LOW (ref 98–111)
Creatinine, Ser: 0.93 mg/dL (ref 0.44–1.00)
GFR calc Af Amer: >60 mL/min (ref >60)
GFR calc non Af Amer: >60 mL/min (ref >60)
Glucose, Bld: 87 mg/dL (ref 70–99)
Potassium: 4.6 mmol/L (ref 3.5–5.1)
Sodium: 134 mmol/L — ABNORMAL LOW (ref 135–145)

## 2019-07-05 LAB — CBC
HCT: 39.6 % (ref 36.0–46.0)
Hemoglobin: 12.7 g/dL (ref 12.0–15.0)
MCH: 30.1 pg (ref 26.0–34.0)
MCHC: 32.1 g/dL (ref 30.0–36.0)
MCV: 93.8 fL (ref 80.0–100.0)
Platelets: 281 10*3/uL (ref 150–400)
RBC: 4.22 MIL/uL (ref 3.87–5.11)
RDW: 13.1 % (ref 11.5–15.5)
WBC: 6 10*3/uL (ref 4.0–10.5)
nRBC: 0 % (ref 0.0–0.2)

## 2019-07-05 LAB — SURGICAL PCR SCREEN
MRSA, PCR: NEGATIVE
Staphylococcus aureus: POSITIVE — AB

## 2019-07-05 NOTE — Progress Notes (Signed)
PCP - C. Podraza PA Cardiologist - none  Chest x-ray - no EKG - no Stress Test - no ECHO - no Cardiac Cath - no  Sleep Study - noCPAP -   Fasting Blood Sugar - NA Checks Blood Sugar _____ times a day  Blood Thinner Instructions: NA Aspirin Instructions: Last Dose:  Anesthesia review:   Patient denies shortness of breath, fever, cough and chest pain at PAT appointment yes  Patient verbalized understanding of instructions that were given to them at the PAT appointment. Patient was also instructed that they will need to review over the PAT instructions again at home before surgery. yes

## 2019-07-06 ENCOUNTER — Other Ambulatory Visit (HOSPITAL_COMMUNITY)
Admission: RE | Admit: 2019-07-06 | Discharge: 2019-07-06 | Disposition: A | Discharge status: Home / Self Care | Payer: Commercial Managed Care - PPO | Source: Ambulatory Visit | Attending: Orthopaedic Surgery | Admitting: Orthopaedic Surgery

## 2019-07-06 DIAGNOSIS — Z01812 Encounter for preprocedural laboratory examination: Secondary | ICD-10-CM | POA: Insufficient documentation

## 2019-07-06 DIAGNOSIS — U071 COVID-19: Secondary | ICD-10-CM | POA: Diagnosis not present

## 2019-07-06 NOTE — Telephone Encounter (Signed)
My chart message came back unread, patient informed with message.

## 2019-07-07 ENCOUNTER — Other Ambulatory Visit: Payer: Self-pay

## 2019-07-07 ENCOUNTER — Encounter (HOSPITAL_COMMUNITY): Payer: Self-pay | Admitting: Physician Assistant

## 2019-07-08 LAB — NOVEL CORONAVIRUS, NAA (HOSP ORDER, SEND-OUT TO REF LAB; TAT 18-24 HRS): SARS-CoV-2, NAA: DETECTED — AB

## 2019-07-08 NOTE — Progress Notes (Signed)
VM left with surgical scheduler regarding pt's covid results. Awaiting callback if needed.   Jacqlyn Larsen, RN

## 2019-07-09 ENCOUNTER — Encounter (HOSPITAL_COMMUNITY): Admission: RE | Payer: Self-pay | Source: Home / Self Care

## 2019-07-09 ENCOUNTER — Ambulatory Visit (HOSPITAL_COMMUNITY)
Admission: RE | Admit: 2019-07-09 | Payer: Commercial Managed Care - PPO | Source: Home / Self Care | Admitting: Orthopaedic Surgery

## 2019-07-09 SURGERY — ARTHROPLASTY, HIP, TOTAL, ANTERIOR APPROACH
Anesthesia: Choice | Site: Hip | Laterality: Right

## 2019-07-15 ENCOUNTER — Telehealth: Payer: Self-pay | Admitting: Physician Assistant

## 2019-07-15 NOTE — Telephone Encounter (Signed)
pts husband called for patient. Since patient cannot have surgery here, needs to get xrays on CD. Also, brought in MRI CD from [facility], wants to get that too. Please let me know when these are ready and I'll contact patient. He is to bring me release form. Thanks!

## 2019-07-16 ENCOUNTER — Telehealth: Payer: Self-pay | Admitting: Physician Assistant

## 2019-07-16 NOTE — Telephone Encounter (Signed)
pts husband called. They found doctor at [facility] that would do surgery and need the covid 19 test to show date tested so they can move forward in getting surgery scheduled. Patient has signed new release form for the test results as they need as soon as possible. Emailed to patient

## 2019-07-16 NOTE — Telephone Encounter (Signed)
Records & xray CD ready for patient to pickup. Patient will bring release form will comes to pick up records.

## 2019-07-16 NOTE — Telephone Encounter (Signed)
Gave Tammy CD of x-rays taken in our office, unable to find CD of patients pervious MRI.

## 2019-07-30 ENCOUNTER — Ambulatory Visit: Admit: 2019-07-30 | Payer: Commercial Managed Care - PPO | Admitting: Orthopaedic Surgery

## 2019-07-30 SURGERY — ARTHROPLASTY, HIP, TOTAL, ANTERIOR APPROACH
Anesthesia: Choice | Site: Hip | Laterality: Right

## 2019-08-13 ENCOUNTER — Other Ambulatory Visit: Payer: Self-pay | Admitting: Psychiatry

## 2019-08-13 DIAGNOSIS — G4700 Insomnia, unspecified: Secondary | ICD-10-CM

## 2019-08-15 NOTE — Telephone Encounter (Signed)
Apt 09/21/2019

## 2019-09-21 ENCOUNTER — Other Ambulatory Visit: Payer: Self-pay

## 2019-09-21 ENCOUNTER — Ambulatory Visit (INDEPENDENT_AMBULATORY_CARE_PROVIDER_SITE_OTHER): Payer: Commercial Managed Care - PPO | Admitting: Psychiatry

## 2019-09-21 ENCOUNTER — Encounter: Payer: Self-pay | Admitting: Psychiatry

## 2019-09-21 DIAGNOSIS — G4700 Insomnia, unspecified: Secondary | ICD-10-CM | POA: Diagnosis not present

## 2019-09-21 DIAGNOSIS — F900 Attention-deficit hyperactivity disorder, predominantly inattentive type: Secondary | ICD-10-CM | POA: Diagnosis not present

## 2019-09-21 DIAGNOSIS — F3342 Major depressive disorder, recurrent, in full remission: Secondary | ICD-10-CM

## 2019-09-21 MED ORDER — DULOXETINE HCL 60 MG PO CPEP: 60.0000 mg | ORAL_CAPSULE | ORAL | 1 refills | Status: DC

## 2019-09-21 MED ORDER — METHYLPHENIDATE HCL 20 MG PO TABS: ORAL_TABLET | ORAL | 0 refills | Status: DC

## 2019-09-21 MED ORDER — BUPROPION HCL ER (XL) 300 MG PO TB24: 300.0000 mg | ORAL_TABLET | Freq: Every day | ORAL | 1 refills | Status: DC

## 2019-09-21 MED ORDER — AMITRIPTYLINE HCL 10 MG PO TABS: 40.0000 mg | ORAL_TABLET | Freq: Every day | ORAL | 1 refills | Status: DC

## 2019-09-21 MED ORDER — PERPHENAZINE 2 MG PO TABS: 2.0000 mg | ORAL_TABLET | Freq: Every day | ORAL | 1 refills | Status: DC

## 2019-09-21 MED ORDER — DULOXETINE HCL 30 MG PO CPEP: 30.0000 mg | ORAL_CAPSULE | Freq: Every day | ORAL | 1 refills | Status: DC

## 2019-09-21 NOTE — Progress Notes (Signed)
   09/21/19 1717  Facial and Oral Movements  Muscles of Facial Expression 0  Lips and Perioral Area 0  Jaw 0  Tongue 0  Extremity Movements  Upper (arms, wrists, hands, fingers) 0  Lower (legs, knees, ankles, toes) 0  Trunk Movements  Neck, shoulders, hips 0  Overall Severity  Severity of abnormal movements (highest score from questions above) 0  Incapacitation due to abnormal movements 0  Patient's awareness of abnormal movements (rate only patient's report) 1  AIMS Total Score  AIMS Total Score 1  Reports rare slight movements when lying very still in her arm or leg.

## 2019-09-21 NOTE — Progress Notes (Signed)
Flormaria Moilanen Tinoco [id number] 59 y.o.  Subjective:   Patient ID:  Jeanne [name] is a 59 y.o. (DOB 1960-11-18) female.  Chief Complaint: No chief complaint on file.   HPI Yesika Naegele Jamie presents to the office today for follow-up of depression, anxiety, and ADD. She reports that her mood has been ok. Denies anxiety. Sleeping well. Appetite has been good. Reports that she has been going to PT regularly and has some physical restrictions. Energy and motivation have been good. She reports that her concentration is ok. Reports that she is disorganized. Denies SI.   Work has been going well. Has upcoming spring break.   Reports that her hip replacement surgery went well. Has had both covid vaccinations.   Past medication trials: Prozac-took for approximately 30 to 40 years Zoloft Wellbutrin Amitriptyline Perphenazine Methylphenidate Adderall Abilify Lamictal-itching Cymbalta [place]     Office Visit from 09/21/2019 in [place] Visit from 06/01/2019 in [place] Total Score  1  0       Review of Systems:  Review of Systems  Musculoskeletal: Negative for gait problem.       Has scoliosis and compression fractures. Has MRI scheduled. Recovering from hip replacement surgery.  Neurological: Negative for tremors.  Psychiatric/Behavioral:       Please refer to HPI    Medications: I have reviewed the patient's current medications.  Current Outpatient Medications  Medication Sig Dispense Refill  . atorvastatin (LIPITOR) 20 MG tablet Take 20 mg by mouth daily.     . Calcium Carbonate-Vitamin D (CALCIUM + D PO) Take 1 tablet by mouth 2 (two) times daily.     . DULoxetine (CYMBALTA) 30 MG capsule Take 1 capsule (30 mg total) by mouth daily. TAKE ONE CAPSULE BY MOUTH DAILY TAKE WITH 60 MG CAPSULE TO EQUAL A TOTAL DOSE OF 90 MG 90 capsule 1  . gabapentin (NEURONTIN) 800 MG tablet Take 800 mg by mouth 3 (three) times  daily.    Marland Kitchen perphenazine (TRILAFON) 2 MG tablet Take 1 tablet (2 mg total) by mouth at bedtime. 90 tablet 1  . polyethylene glycol powder (MIRALAX) powder Take 17 g by mouth at bedtime.     Marland Kitchen zolpidem (AMBIEN) 10 MG tablet TAKE 1 AND 1/2 TABLET BY MOUTH EVERY NIGHT AT BEDTIME 135 tablet 0  . amitriptyline (ELAVIL) 10 MG tablet Take 4 tablets (40 mg total) by mouth at bedtime. For 10 mg 360 tablet 1  . buPROPion (WELLBUTRIN XL) 300 MG 24 hr tablet Take 1 tablet (300 mg total) by mouth daily. 90 tablet 1  . DULoxetine (CYMBALTA) 60 MG capsule Take 1 capsule (60 mg total) by mouth every morning. Take with 30 mg capsule to equal 90 mg qd 90 capsule 1  . [START ON 09/30/2019] methylphenidate (RITALIN) 20 MG tablet Take 1 tablet (20 mg total) by mouth every morning AND 0.5 tablets (10 mg total) daily at 12 noon. 135 tablet 0  . omeprazole (PRILOSEC) 20 MG capsule Take 20 mg by mouth daily as needed (heartburn/indigestion.).     Marland Kitchen Polyethyl Glycol-Propyl Glycol (LUBRICANT EYE DROPS) 0.4-0.3 % SOLN Place 1 drop into both eyes 3 (three) times daily as needed (dry/irritated eyes.).    Marland Kitchen valACYclovir (VALTREX) 500 MG tablet Take 1 tablet (500 mg total) by mouth 2 (two) times daily. (Patient taking differently: Take 500 mg by mouth 2 (two) times daily as needed (outbreaks). ) 20 tablet 1  No current facility-administered medications for this visit.    Medication Side Effects: None  Allergies:  Allergies  Allergen Reactions  . Lamictal [Lamotrigine] Itching    Past Medical History:  Diagnosis Date  . Acid reflux   . Anxiety   . Back pain   . Depression   . Elevated cholesterol   . HSV (herpes simplex virus) anogenital infection   . Hx gestational diabetes   . Hyperprolactinemia ([place])    valued 85 with normal MRI 2012  . Osteopenia 06/2019   T score -1.1 stable from prior DEXA FRAX 11% / 0.3%    Family History  Problem Relation Age of Onset  . Hypertension Mother   . Depression Mother   .  Heart disease Father   . Hypertension Father   . Heart attack Father 70       heart attack, died  . Alcohol abuse Father   . Diabetes Maternal Grandmother   . Cancer Maternal Grandmother        colon  . Colon cancer Maternal Grandmother 4  . Anxiety disorder Maternal Grandmother   . Depression Maternal Grandmother   . Diabetes Maternal Grandfather   . Drug abuse Son   . Drug abuse Son   . Drug abuse Son     Social History   Socioeconomic History  . Marital status: Married    Spouse name: Not on file  . Number of children: Not on file  . Years of education: Not on file  . Highest education level: Not on file  Occupational History  . Not on file  Tobacco Use  . Smoking status: Never Smoker  . Smokeless tobacco: Never Used  Substance and Sexual Activity  . Alcohol use: Yes    Alcohol/week: 0.0 standard drinks    Comment: Rare  . Drug use: No  . Sexual activity: Yes    Birth control/protection: Post-menopausal, Surgical    Comment: vasectomy-1st intercourse 12 yo-5 partners  Other Topics Concern  . Not on file  Social History Narrative  . Not on file   Social Determinants of Health   Financial Resource Strain:   . Difficulty of Paying Living Expenses:   Food Insecurity:   . Worried About Charity fundraiser in the Last Year:   . Arboriculturist in the Last Year:   Transportation Needs:   . Film/video editor (Medical):   Marland Kitchen Lack of Transportation (Non-Medical):   Physical Activity:   . Days of Exercise per Week:   . Minutes of Exercise per Session:   Stress:   . Feeling of Stress :   Social Connections:   . Frequency of Communication with Friends and Family:   . Frequency of Social Gatherings with Friends and Family:   . Attends Religious Services:   . Active Member of Clubs or Organizations:   . Attends Archivist Meetings:   Marland Kitchen Marital Status:   Intimate Partner Violence:   . Fear of Current or Ex-Partner:   . Emotionally Abused:   Marland Kitchen  Physically Abused:   . Sexually Abused:     Past Medical History, Surgical history, Social history, and Family history were reviewed and updated as appropriate.   Please see review of systems for further details on the patient's review from today.   Objective:   Physical Exam:  BP 121/77   Pulse 90   Wt 136 lb (61.7 kg)   LMP 07/01/2002   BMI 24.87 kg/m  Physical Exam  Lab Review:     Component Value Date/Time   NA 134 (L) 07/05/2019 1436   K 4.6 07/05/2019 1436   CL 97 (L) 07/05/2019 1436   CO2 30 07/05/2019 1436   GLUCOSE 87 07/05/2019 1436   BUN 21 (H) 07/05/2019 1436   CREATININE 0.93 07/05/2019 1436   CREATININE 0.97 02/11/2017 0838   CALCIUM 9.1 07/05/2019 1436   PROT 6.6 02/11/2017 0838   ALBUMIN 4.4 02/11/2017 0838   AST 19 02/11/2017 0838   ALT 18 02/11/2017 0838   ALKPHOS 68 02/11/2017 0838   BILITOT 0.4 02/11/2017 0838   GFRNONAA >60 07/05/2019 1436   GFRAA >60 07/05/2019 1436       Component Value Date/Time   WBC 6.0 07/05/2019 1436   RBC 4.22 07/05/2019 1436   HGB 12.7 07/05/2019 1436   HCT 39.6 07/05/2019 1436   PLT 281 07/05/2019 1436   MCV 93.8 07/05/2019 1436   MCH 30.1 07/05/2019 1436   MCHC 32.1 07/05/2019 1436   RDW 13.1 07/05/2019 1436   LYMPHSABS 1.4 02/03/2015 0943   MONOABS 0.4 02/03/2015 0943   EOSABS 0.1 02/03/2015 0943   BASOSABS 0.1 02/03/2015 0943    No results found for: POCLITH, LITHIUM   No results found for: PHENYTOIN, PHENOBARB, VALPROATE, CBMZ   .res Assessment: Plan:   Will continue current plan of care since target signs and symptoms are well controlled without any tolerability issues. Will continue Ambien 15 mg po QHS for insomnia. Pt reports that she does not need script at this time. Pt to f/u in 3 months or sooner if clinically indicated.  Patient advised to contact office with any questions, adverse effects, or acute worsening in signs and symptoms.  Diagnoses and all orders for this visit:  Attention  deficit hyperactivity disorder (ADHD), predominantly inattentive type -     methylphenidate (RITALIN) 20 MG tablet; Take 1 tablet (20 mg total) by mouth every morning AND 0.5 tablets (10 mg total) daily at 12 noon.  Recurrent major depressive disorder, in full remission ([place]) -     amitriptyline (ELAVIL) 10 MG tablet; Take 4 tablets (40 mg total) by mouth at bedtime. For 10 mg -     buPROPion (WELLBUTRIN XL) 300 MG 24 hr tablet; Take 1 tablet (300 mg total) by mouth daily. -     DULoxetine (CYMBALTA) 60 MG capsule; Take 1 capsule (60 mg total) by mouth every morning. Take with 30 mg capsule to equal 90 mg qd -     DULoxetine (CYMBALTA) 30 MG capsule; Take 1 capsule (30 mg total) by mouth daily. TAKE ONE CAPSULE BY MOUTH DAILY TAKE WITH 60 MG CAPSULE TO EQUAL A TOTAL DOSE OF 90 MG -     perphenazine (TRILAFON) 2 MG tablet; Take 1 tablet (2 mg total) by mouth at bedtime.  Insomnia, unspecified type -     amitriptyline (ELAVIL) 10 MG tablet; Take 4 tablets (40 mg total) by mouth at bedtime. For 10 mg     Please see After Visit Summary for patient specific instructions.  Future Appointments  Date Time Provider [place]  12/22/2019  8:00 AM [place], Berdie Ogren, NP CP-CP None    No orders of the defined types were placed in this encounter.   -------------------------------

## 2019-12-22 ENCOUNTER — Ambulatory Visit: Payer: Commercial Managed Care - PPO | Admitting: Adult Health

## 2019-12-22 ENCOUNTER — Encounter: Payer: Self-pay | Admitting: Obstetrics and Gynecology

## 2019-12-30 ENCOUNTER — Other Ambulatory Visit: Payer: Self-pay | Admitting: Psychiatry

## 2019-12-30 DIAGNOSIS — G4700 Insomnia, unspecified: Secondary | ICD-10-CM

## 2019-12-30 DIAGNOSIS — F3342 Major depressive disorder, recurrent, in full remission: Secondary | ICD-10-CM

## 2020-01-13 ENCOUNTER — Other Ambulatory Visit: Payer: Self-pay

## 2020-01-13 ENCOUNTER — Ambulatory Visit (INDEPENDENT_AMBULATORY_CARE_PROVIDER_SITE_OTHER): Payer: Commercial Managed Care - PPO | Admitting: Psychiatry

## 2020-01-13 ENCOUNTER — Encounter: Payer: Self-pay | Admitting: Psychiatry

## 2020-01-13 DIAGNOSIS — F3342 Major depressive disorder, recurrent, in full remission: Secondary | ICD-10-CM | POA: Diagnosis not present

## 2020-01-13 DIAGNOSIS — G4700 Insomnia, unspecified: Secondary | ICD-10-CM | POA: Diagnosis not present

## 2020-01-13 DIAGNOSIS — F900 Attention-deficit hyperactivity disorder, predominantly inattentive type: Secondary | ICD-10-CM

## 2020-01-13 MED ORDER — METHYLPHENIDATE HCL 20 MG PO TABS: ORAL_TABLET | ORAL | 0 refills | Status: DC

## 2020-01-13 MED ORDER — ZOLPIDEM TARTRATE 10 MG PO TABS: ORAL_TABLET | ORAL | 0 refills | Status: DC

## 2020-01-13 MED ORDER — BUPROPION HCL ER (XL) 300 MG PO TB24: 300.0000 mg | ORAL_TABLET | Freq: Every morning | ORAL | 5 refills | Status: DC

## 2020-01-13 MED ORDER — AMITRIPTYLINE HCL 10 MG PO TABS: 40.0000 mg | ORAL_TABLET | Freq: Every day | ORAL | 1 refills | Status: DC

## 2020-01-13 MED ORDER — DULOXETINE HCL 30 MG PO CPEP: ORAL_CAPSULE | ORAL | 5 refills | Status: DC

## 2020-01-13 MED ORDER — PERPHENAZINE 2 MG PO TABS: 2.0000 mg | ORAL_TABLET | Freq: Every day | ORAL | 1 refills | Status: DC

## 2020-01-13 MED ORDER — DULOXETINE HCL 60 MG PO CPEP: 60.0000 mg | ORAL_CAPSULE | ORAL | 5 refills | Status: DC

## 2020-01-13 NOTE — Progress Notes (Signed)
Jeanne [name] [id number] [birth year] 59 y.o.  Subjective:   Patient ID:  Jeanne [name] is a 59 y.o. (DOB [birth year]) female.  Chief Complaint:  Chief Complaint  Patient presents with  . Follow-up    h/o anxiety, depression, insomnia, and ADD    HPI Jeanne [name] presents to the office today for follow-up of depression, anxiety, insomnia, and ADD. Denies depressed mood. Denies anxiety. She reports, "I'm doing well." She has been sleeping well. Sleeping about 8 hours a night. Energy and motivation have been good. Concentration has been adequate. Denies SI.   She reports that during the end of school she changed Ritalin administration to 1/2 tablet in the morning and 1 tab in the afternoon and reports that this helped her get through the end of the day and seemed to decrease anxiety without causing any sleep disturbance. 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  Has resumed 1 tab po q am and 1/2 tab in the afternoon while on summer break. Reports that she may revert to 1/2 tab po q am and 1 tab mid-day when she returns to school after summer break.   Currently on summer break. Has been able to spend time with her family. Has been exercising regularly. Has gotten together with friends. Enjoys animals.   Past medication trials: Prozac-took for approximately 30 to 40 years Zoloft Wellbutrin Amitriptyline Perphenazine Methylphenidate Adderall Abilify Lamictal-itching Cymbalta [place]     Office Visit from 09/21/2019 in [place] Visit from 06/01/2019 in [place] Total Score 1 0       Review of Systems:  Review of Systems  Cardiovascular: Negative for palpitations.  Musculoskeletal: Negative for gait problem.  Neurological: Negative for tremors.       Neuropathy  Psychiatric/Behavioral:       Please refer to HPI    Medications: I have reviewed the patient's current  medications.  Current Outpatient Medications  Medication Sig Dispense Refill  . alendronate (FOSAMAX) 70 MG tablet Take by mouth.    Marland Kitchen amitriptyline (ELAVIL) 10 MG tablet Take 4 tablets (40 mg total) by mouth at bedtime. 360 tablet 1  . atorvastatin (LIPITOR) 20 MG tablet Take 20 mg by mouth daily.     Marland Kitchen buPROPion (WELLBUTRIN XL) 300 MG 24 hr tablet Take 1 tablet (300 mg total) by mouth every morning. 30 tablet 5  . Calcium Carbonate-Vitamin D (CALCIUM + D PO) Take 1 tablet by mouth 2 (two) times daily.     . DULoxetine (CYMBALTA) 30 MG capsule TAKE ONE CAPSULE BY MOUTH DAILY TAKE WITH 60 MG CAPSULE TO EQUAL A TOTAL DOSE OF 90 MG 30 capsule 5  . gabapentin (NEURONTIN) 800 MG tablet Take 1,200 mg by mouth 3 (three) times daily.     Marland Kitchen omeprazole (PRILOSEC) 20 MG capsule Take 20 mg by mouth daily as needed (heartburn/indigestion.).     Marland Kitchen perphenazine (TRILAFON) 2 MG tablet Take 1 tablet (2 mg total) by mouth at bedtime. 90 tablet 1  . Polyethyl Glycol-Propyl Glycol (LUBRICANT EYE DROPS) 0.4-0.3 % SOLN Place 1 drop into both eyes 3 (three) times daily as needed (dry/irritated eyes.).    Marland Kitchen polyethylene glycol powder (MIRALAX) powder Take 17 g by mouth at bedtime.     Derrill Memo ON 02/05/2020] zolpidem (AMBIEN) 10 MG tablet TAKE 1 AND 1/2 TABLET BY MOUTH EVERY NIGHT AT BEDTIME 135 tablet 0  .  DULoxetine (CYMBALTA) 60 MG capsule Take 1 capsule (60 mg total) by mouth every morning. Take with 30 mg capsule to equal 90 mg qd 30 capsule 5  . methylphenidate (RITALIN) 20 MG tablet Take 1 tablet (20 mg total) by mouth every morning AND 0.5 tablets (10 mg total) daily at 12 noon. 135 tablet 0  . valACYclovir (VALTREX) 500 MG tablet Take 1 tablet (500 mg total) by mouth 2 (two) times daily. (Patient taking differently: Take 500 mg by mouth 2 (two) times daily as needed (outbreaks). ) 20 tablet 1   No current facility-administered medications for this visit.    Medication Side Effects: None  Allergies:   Allergies  Allergen Reactions  . Lamictal [Lamotrigine] Itching    Past Medical History:  Diagnosis Date  . Acid reflux   . Anxiety   . Back pain   . Depression   . Elevated cholesterol   . HSV (herpes simplex virus) anogenital infection   . Hx gestational diabetes   . Hyperprolactinemia ([place])    valued 42 with normal MRI 2012  . Osteopenia 06/2019   T score -1.1 stable from prior DEXA FRAX 11% / 0.3%    Family History  Problem Relation Age of Onset  . Hypertension Mother   . Depression Mother   . Heart disease Father   . Hypertension Father   . Heart attack Father 79       heart attack, died  . Alcohol abuse Father   . Diabetes Maternal Grandmother   . Cancer Maternal Grandmother        colon  . Colon cancer Maternal Grandmother 61  . Anxiety disorder Maternal Grandmother   . Depression Maternal Grandmother   . Diabetes Maternal Grandfather   . Drug abuse Son   . Drug abuse Son   . Drug abuse Son     Social History   Socioeconomic History  . Marital status: Married    Spouse name: Not on file  . Number of children: Not on file  . Years of education: Not on file  . Highest education level: Not on file  Occupational History  . Not on file  Tobacco Use  . Smoking status: Never Smoker  . Smokeless tobacco: Never Used  Vaping Use  . Vaping Use: Never used  Substance and Sexual Activity  . Alcohol use: Yes    Alcohol/week: 0.0 standard drinks    Comment: Rare  . Drug use: No  . Sexual activity: Yes    Birth control/protection: Post-menopausal, Surgical    Comment: vasectomy-1st intercourse 12 yo-5 partners  Other Topics Concern  . Not on file  Social History Narrative  . Not on file   Social Determinants of Health   Financial Resource Strain:   . Difficulty of Paying Living Expenses:   Food Insecurity:   . Worried About Charity fundraiser in the Last Year:   . Arboriculturist in the Last Year:   Transportation Needs:   . Lexicographer (Medical):   Marland Kitchen Lack of Transportation (Non-Medical):   Physical Activity:   . Days of Exercise per Week:   . Minutes of Exercise per Session:   Stress:   . Feeling of Stress :   Social Connections:   . Frequency of Communication with Friends and Family:   . Frequency of Social Gatherings with Friends and Family:   . Attends Religious Services:   . Active Member of Clubs or Organizations:   .  Attends Archivist Meetings:   Marland Kitchen Marital Status:   Intimate Partner Violence:   . Fear of Current or Ex-Partner:   . Emotionally Abused:   Marland Kitchen Physically Abused:   . Sexually Abused:     Past Medical History, Surgical history, Social history, and Family history were reviewed and updated as appropriate.   Please see review of systems for further details on the patient's review from today.   Objective:   Physical Exam:  BP 124/78   Pulse 89   LMP 07/01/2002   Physical Exam Constitutional:      General: She is not in acute distress. Musculoskeletal:        General: No deformity.  Neurological:     Mental Status: She is alert and oriented to person, place, and time.     Coordination: Coordination normal.  Psychiatric:        Attention and Perception: Attention and perception normal. She does not perceive auditory or visual hallucinations.        Mood and Affect: Mood normal. Mood is not anxious or depressed. Affect is not labile, blunt, angry or inappropriate.        Speech: Speech normal.        Behavior: Behavior normal.        Thought Content: Thought content normal. Thought content is not paranoid or delusional. Thought content does not include homicidal or suicidal ideation. Thought content does not include homicidal or suicidal plan.        Cognition and Memory: Cognition and memory normal.        Judgment: Judgment normal.     Comments: Insight intact     Lab Review:     Component Value Date/Time   NA 134 (L) 07/05/2019 1436   K 4.6 07/05/2019 1436    CL 97 (L) 07/05/2019 1436   CO2 30 07/05/2019 1436   GLUCOSE 87 07/05/2019 1436   BUN 21 (H) 07/05/2019 1436   CREATININE 0.93 07/05/2019 1436   CREATININE 0.97 02/11/2017 0838   CALCIUM 9.1 07/05/2019 1436   PROT 6.6 02/11/2017 0838   ALBUMIN 4.4 02/11/2017 0838   AST 19 02/11/2017 0838   ALT 18 02/11/2017 0838   ALKPHOS 68 02/11/2017 0838   BILITOT 0.4 02/11/2017 0838   GFRNONAA >60 07/05/2019 1436   GFRAA >60 07/05/2019 1436       Component Value Date/Time   WBC 6.0 07/05/2019 1436   RBC 4.22 07/05/2019 1436   HGB 12.7 07/05/2019 1436   HCT 39.6 07/05/2019 1436   PLT 281 07/05/2019 1436   MCV 93.8 07/05/2019 1436   MCH 30.1 07/05/2019 1436   MCHC 32.1 07/05/2019 1436   RDW 13.1 07/05/2019 1436   LYMPHSABS 1.4 02/03/2015 0943   MONOABS 0.4 02/03/2015 0943   EOSABS 0.1 02/03/2015 0943   BASOSABS 0.1 02/03/2015 0943    No results found for: POCLITH, LITHIUM   No results found for: PHENYTOIN, PHENOBARB, VALPROATE, CBMZ   .res Assessment: Plan:   Will continue current plan of care since target signs and symptoms are well controlled without any tolerability issues. Pt to f/u in 3 months or sooner if clinically indicated.  Patient advised to contact office with any questions, adverse effects, or acute worsening in signs and symptoms.  Jeanne [name] was seen today for follow-up.  Diagnoses and all orders for this visit:  Recurrent major depressive disorder, in full remission ([place]) -     amitriptyline (ELAVIL) 10 MG tablet; Take 4 tablets (40  mg total) by mouth at bedtime. -     buPROPion (WELLBUTRIN XL) 300 MG 24 hr tablet; Take 1 tablet (300 mg total) by mouth every morning. -     DULoxetine (CYMBALTA) 30 MG capsule; TAKE ONE CAPSULE BY MOUTH DAILY TAKE WITH 60 MG CAPSULE TO EQUAL A TOTAL DOSE OF 90 MG -     DULoxetine (CYMBALTA) 60 MG capsule; Take 1 capsule (60 mg total) by mouth every morning. Take with 30 mg capsule to equal 90 mg qd -     perphenazine (TRILAFON) 2 MG  tablet; Take 1 tablet (2 mg total) by mouth at bedtime.  Insomnia, unspecified type -     amitriptyline (ELAVIL) 10 MG tablet; Take 4 tablets (40 mg total) by mouth at bedtime. -     zolpidem (AMBIEN) 10 MG tablet; TAKE 1 AND 1/2 TABLET BY MOUTH EVERY NIGHT AT BEDTIME  Attention deficit hyperactivity disorder (ADHD), predominantly inattentive type -     methylphenidate (RITALIN) 20 MG tablet; Take 1 tablet (20 mg total) by mouth every morning AND 0.5 tablets (10 mg total) daily at 12 noon.     Please see After Visit Summary for patient specific instructions.  Future Appointments  Date Time Provider [place]  04/13/2020  4:30 PM Thayer Headings, PMHNP CP-CP None    No orders of the defined types were placed in this encounter.   -------------------------------

## 2020-03-23 ENCOUNTER — Other Ambulatory Visit: Payer: Self-pay | Admitting: Neurosurgery

## 2020-03-23 DIAGNOSIS — M25561 Pain in right knee: Secondary | ICD-10-CM

## 2020-04-13 ENCOUNTER — Other Ambulatory Visit: Payer: Self-pay

## 2020-04-13 ENCOUNTER — Encounter: Payer: Self-pay | Admitting: Psychiatry

## 2020-04-13 ENCOUNTER — Ambulatory Visit (INDEPENDENT_AMBULATORY_CARE_PROVIDER_SITE_OTHER): Payer: Commercial Managed Care - PPO | Admitting: Psychiatry

## 2020-04-13 DIAGNOSIS — F3342 Major depressive disorder, recurrent, in full remission: Secondary | ICD-10-CM

## 2020-04-13 DIAGNOSIS — G4700 Insomnia, unspecified: Secondary | ICD-10-CM

## 2020-04-13 DIAGNOSIS — F900 Attention-deficit hyperactivity disorder, predominantly inattentive type: Secondary | ICD-10-CM | POA: Diagnosis not present

## 2020-04-13 MED ORDER — AMITRIPTYLINE HCL 10 MG PO TABS: 40.0000 mg | ORAL_TABLET | Freq: Every day | ORAL | 1 refills | Status: DC

## 2020-04-13 MED ORDER — METHYLPHENIDATE HCL 20 MG PO TABS: ORAL_TABLET | ORAL | 0 refills | Status: DC

## 2020-04-13 MED ORDER — BUPROPION HCL ER (XL) 300 MG PO TB24: 300.0000 mg | ORAL_TABLET | Freq: Every morning | ORAL | 1 refills | Status: DC

## 2020-04-13 MED ORDER — DULOXETINE HCL 30 MG PO CPEP: ORAL_CAPSULE | ORAL | 5 refills | Status: DC

## 2020-04-13 MED ORDER — DULOXETINE HCL 60 MG PO CPEP: 60.0000 mg | ORAL_CAPSULE | ORAL | 5 refills | Status: DC

## 2020-04-13 MED ORDER — PERPHENAZINE 2 MG PO TABS: 2.0000 mg | ORAL_TABLET | Freq: Every day | ORAL | 1 refills | Status: DC

## 2020-04-13 MED ORDER — ZOLPIDEM TARTRATE 10 MG PO TABS: ORAL_TABLET | ORAL | 0 refills | Status: DC

## 2020-04-13 NOTE — Progress Notes (Signed)
   04/13/20 1633  Facial and Oral Movements  Muscles of Facial Expression 0  Lips and Perioral Area 0  Jaw 0  Tongue 0  Extremity Movements  Upper (arms, wrists, hands, fingers) 0  Lower (legs, knees, ankles, toes) 0  Trunk Movements  Neck, shoulders, hips 0  Overall Severity  Severity of abnormal movements (highest score from questions above) 0  Incapacitation due to abnormal movements 0  Patient's awareness of abnormal movements (rate only patient's report) 1 (Rare episodes of twitches)  AIMS Total Score  AIMS Total Score 1

## 2020-04-13 NOTE — Progress Notes (Signed)
Jeanne [name] [id number] [birth year] 59 y.o.  Subjective:   Patient ID:  Jeanne [name] is a 59 y.o. (DOB 09-03-1960) female.  Chief Complaint:  Chief Complaint  Patient presents with  . Follow-up    Anxiety, depression, insomnia, ADD    HPI Jeanne [name] presents to the office today for follow-up of depression, anxiety, insomnia, and ADD. 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 She reports that she is going to try to continue teaching for a couple more years. She reports that she has a helpful assistant that is her good friend. She reports that she has 2 good friends. She denies irritability outside of work. She reports that she is "drained" at the end of the work day. Energy and motivation are ok. Sleeping well. Goes to bed at 7 pm and awakens at 4:30 am. 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 She reports that her mood is ok- "I'm stable, I'm steady. I have happy moments." Denies anxiety. Appetite has been good. Denies difficulty with concentration. Denies SI.    Past medication trials: Prozac-took for approximately 30 to 40 years Zoloft Wellbutrin Amitriptyline Perphenazine Methylphenidate Adderall Abilify Lamictal-itching Cymbalta [place]     Office Visit from 04/13/2020 in [place] [facility] Visit from 09/21/2019 in [place] Visit from 06/01/2019 in [facility]  AIMS Total Score 1 1 0       Review of Systems:  Review of Systems  Musculoskeletal: Positive for arthralgias and back pain. Negative for gait problem.  Neurological: Negative for tremors.  Psychiatric/Behavioral:       Please refer to HPI    Medications: I have reviewed the patient's current medications.  Current Outpatient Medications  Medication Sig Dispense Refill  . alendronate (FOSAMAX) 70 MG tablet Take by mouth.    Marland Kitchen atorvastatin (LIPITOR) 20 MG tablet Take 20 mg by mouth daily.     . Calcium Carbonate-Vitamin D  (CALCIUM + D PO) Take 1 tablet by mouth 2 (two) times daily.     . DULoxetine (CYMBALTA) 30 MG capsule TAKE ONE CAPSULE BY MOUTH DAILY TAKE WITH 60 MG CAPSULE TO EQUAL A TOTAL DOSE OF 90 MG 30 capsule 5  . gabapentin (NEURONTIN) 800 MG tablet Take 1,200 mg by mouth 3 (three) times daily.     Marland Kitchen omeprazole (PRILOSEC) 20 MG capsule Take 20 mg by mouth daily as needed (heartburn/indigestion.).     Marland Kitchen perphenazine (TRILAFON) 2 MG tablet Take 1 tablet (2 mg total) by mouth at bedtime. 90 tablet 1  . polyethylene glycol powder (MIRALAX) powder Take 17 g by mouth at bedtime.     Derrill Memo ON 05/01/2020] zolpidem (AMBIEN) 10 MG tablet TAKE 1 AND 1/2 TABLET BY MOUTH EVERY NIGHT AT BEDTIME 135 tablet 0  . amitriptyline (ELAVIL) 10 MG tablet Take 4 tablets (40 mg total) by mouth at bedtime. 360 tablet 1  . buPROPion (WELLBUTRIN XL) 300 MG 24 hr tablet Take 1 tablet (300 mg total) by mouth every morning. 90 tablet 1  . DULoxetine (CYMBALTA) 60 MG capsule Take 1 capsule (60 mg total) by mouth every morning. Take with 30 mg capsule to equal 90 mg qd 30 capsule 5  . methylphenidate (RITALIN) 20 MG tablet Take 1 tablet (20 mg total) by mouth every morning AND 0.5 tablets (10 mg total) daily at 12 noon. 135 tablet 0  . Polyethyl Glycol-Propyl Glycol (LUBRICANT EYE DROPS) 0.4-0.3 % SOLN Place 1  drop into both eyes 3 (three) times daily as needed (dry/irritated eyes.).    Marland Kitchen valACYclovir (VALTREX) 500 MG tablet Take 1 tablet (500 mg total) by mouth 2 (two) times daily. (Patient taking differently: Take 500 mg by mouth 2 (two) times daily as needed (outbreaks). ) 20 tablet 1   No current facility-administered medications for this visit.    Medication Side Effects: Other: dry mouth  Allergies:  Allergies  Allergen Reactions  . Lamictal [Lamotrigine] Itching    Past Medical History:  Diagnosis Date  . Acid reflux   . Anxiety   . Back pain   . Depression   . Elevated cholesterol   . HSV (herpes simplex virus)  anogenital infection   . Hx gestational diabetes   . Hyperprolactinemia ([place])    valued 49 with normal MRI 2012  . Osteopenia 06/2019   T score -1.1 stable from prior DEXA FRAX 11% / 0.3%    Family History  Problem Relation Age of Onset  . Hypertension Mother   . Depression Mother   . Heart disease Father   . Hypertension Father   . Heart attack Father 61       heart attack, died  . Alcohol abuse Father   . Diabetes Maternal Grandmother   . Cancer Maternal Grandmother        colon  . Colon cancer Maternal Grandmother 10  . Anxiety disorder Maternal Grandmother   . Depression Maternal Grandmother   . Diabetes Maternal Grandfather   . Drug abuse Son   . Drug abuse Son   . Drug abuse Son     Social History   Socioeconomic History  . Marital status: Married    Spouse name: Not on file  . Number of children: Not on file  . Years of education: Not on file  . Highest education level: Not on file  Occupational History  . Not on file  Tobacco Use  . Smoking status: Never Smoker  . Smokeless tobacco: Never Used  Vaping Use  . Vaping Use: Never used  Substance and Sexual Activity  . Alcohol use: Yes    Alcohol/week: 0.0 standard drinks    Comment: Rare  . Drug use: No  . Sexual activity: Yes    Birth control/protection: Post-menopausal, Surgical    Comment: vasectomy-1st intercourse 12 yo-5 partners  Other Topics Concern  . Not on file  Social History Narrative  . Not on file   Social Determinants of Health   Financial Resource Strain:   . Difficulty of Paying Living Expenses: Not on file  Food Insecurity:   . Worried About Charity fundraiser in the Last Year: Not on file  . Ran Out of Food in the Last Year: Not on file  Transportation Needs:   . Lack of Transportation (Medical): Not on file  . Lack of Transportation (Non-Medical): Not on file  Physical Activity:   . Days of Exercise per Week: Not on file  . Minutes of Exercise per Session: Not on file   Stress:   . Feeling of Stress : Not on file  Social Connections:   . Frequency of Communication with Friends and Family: Not on file  . Frequency of Social Gatherings with Friends and Family: Not on file  . Attends Religious Services: Not on file  . Active Member of Clubs or Organizations: Not on file  . Attends Archivist Meetings: Not on file  . Marital Status: Not on file  Intimate  Partner Violence:   . Fear of Current or Ex-Partner: Not on file  . Emotionally Abused: Not on file  . Physically Abused: Not on file  . Sexually Abused: Not on file    Past Medical History, Surgical history, Social history, and Family history were reviewed and updated as appropriate.   Please see review of systems for further details on the patient's review from today.   Objective:   Physical Exam:  BP (!) 147/94   Pulse 80   LMP 07/01/2002   Physical Exam Constitutional:      General: She is not in acute distress. Musculoskeletal:        General: No deformity.  Neurological:     Mental Status: She is alert and oriented to person, place, and time.     Coordination: Coordination normal.  Psychiatric:        Attention and Perception: Attention and perception normal. She does not perceive auditory or visual hallucinations.        Mood and Affect: Mood normal. Mood is not anxious or depressed. Affect is not labile, blunt, angry or inappropriate.        Speech: Speech normal.        Behavior: Behavior normal.        Thought Content: Thought content normal. Thought content is not paranoid or delusional. Thought content does not include homicidal or suicidal ideation. Thought content does not include homicidal or suicidal plan.        Cognition and Memory: Cognition and memory normal.        Judgment: Judgment normal.     Comments: Insight intact     Lab Review:     Component Value Date/Time   NA 134 (L) 07/05/2019 1436   K 4.6 07/05/2019 1436   CL 97 (L) 07/05/2019 1436   CO2  30 07/05/2019 1436   GLUCOSE 87 07/05/2019 1436   BUN 21 (H) 07/05/2019 1436   CREATININE 0.93 07/05/2019 1436   CREATININE 0.97 02/11/2017 0838   CALCIUM 9.1 07/05/2019 1436   PROT 6.6 02/11/2017 0838   ALBUMIN 4.4 02/11/2017 0838   AST 19 02/11/2017 0838   ALT 18 02/11/2017 0838   ALKPHOS 68 02/11/2017 0838   BILITOT 0.4 02/11/2017 0838   GFRNONAA >60 07/05/2019 1436   GFRAA >60 07/05/2019 1436       Component Value Date/Time   WBC 6.0 07/05/2019 1436   RBC 4.22 07/05/2019 1436   HGB 12.7 07/05/2019 1436   HCT 39.6 07/05/2019 1436   PLT 281 07/05/2019 1436   MCV 93.8 07/05/2019 1436   MCH 30.1 07/05/2019 1436   MCHC 32.1 07/05/2019 1436   RDW 13.1 07/05/2019 1436   LYMPHSABS 1.4 02/03/2015 0943   MONOABS 0.4 02/03/2015 0943   EOSABS 0.1 02/03/2015 0943   BASOSABS 0.1 02/03/2015 0943    No results found for: POCLITH, LITHIUM   No results found for: PHENYTOIN, PHENOBARB, VALPROATE, CBMZ   .res Assessment: Plan:   Will continue current plan of care since target signs and symptoms are well controlled without any tolerability issues. Pt to follow-up in 3 months or sooner if clinically indicated.  Patient advised to contact office with any questions, adverse effects, or acute worsening in signs and symptoms.  Katherinne was seen today for follow-up.  Diagnoses and all orders for this visit:  Insomnia, unspecified type -     zolpidem (AMBIEN) 10 MG tablet; TAKE 1 AND 1/2 TABLET BY MOUTH EVERY NIGHT AT BEDTIME -  amitriptyline (ELAVIL) 10 MG tablet; Take 4 tablets (40 mg total) by mouth at bedtime.  Attention deficit hyperactivity disorder (ADHD), predominantly inattentive type -     methylphenidate (RITALIN) 20 MG tablet; Take 1 tablet (20 mg total) by mouth every morning AND 0.5 tablets (10 mg total) daily at 12 noon.  Recurrent major depressive disorder, in full remission ([place]) -     amitriptyline (ELAVIL) 10 MG tablet; Take 4 tablets (40 mg total) by mouth at  bedtime. -     buPROPion (WELLBUTRIN XL) 300 MG 24 hr tablet; Take 1 tablet (300 mg total) by mouth every morning. -     DULoxetine (CYMBALTA) 30 MG capsule; TAKE ONE CAPSULE BY MOUTH DAILY TAKE WITH 60 MG CAPSULE TO EQUAL A TOTAL DOSE OF 90 MG -     DULoxetine (CYMBALTA) 60 MG capsule; Take 1 capsule (60 mg total) by mouth every morning. Take with 30 mg capsule to equal 90 mg qd -     perphenazine (TRILAFON) 2 MG tablet; Take 1 tablet (2 mg total) by mouth at bedtime.     Please see After Visit Summary for patient specific instructions.  Future Appointments  Date Time Provider [place]  04/22/2020  7:00 AM GI-315 MR 2 GI-315MRI GI-[facility]  05/23/2020  2:00 PM Joseph Pierini, MD [facility]  07/14/2020  8:30 AM Thayer Headings, PMHNP CP-CP None    No orders of the defined types were placed in this encounter.   -------------------------------

## 2020-04-22 ENCOUNTER — Other Ambulatory Visit: Payer: Self-pay

## 2020-04-22 ENCOUNTER — Ambulatory Visit
Admission: RE | Admit: 2020-04-22 | Discharge: 2020-04-22 | Disposition: A | Discharge status: Home / Self Care | Payer: Commercial Managed Care - PPO | Source: Ambulatory Visit | Attending: Neurosurgery | Admitting: Neurosurgery

## 2020-04-22 DIAGNOSIS — M25561 Pain in right knee: Secondary | ICD-10-CM

## 2020-05-22 ENCOUNTER — Ambulatory Visit (INDEPENDENT_AMBULATORY_CARE_PROVIDER_SITE_OTHER): Payer: Commercial Managed Care - PPO | Admitting: Orthopaedic Surgery

## 2020-05-22 ENCOUNTER — Encounter: Payer: Self-pay | Admitting: Orthopaedic Surgery

## 2020-05-22 ENCOUNTER — Telehealth: Payer: Self-pay

## 2020-05-22 DIAGNOSIS — G8929 Other chronic pain: Secondary | ICD-10-CM

## 2020-05-22 DIAGNOSIS — M25561 Pain in right knee: Secondary | ICD-10-CM | POA: Diagnosis not present

## 2020-05-22 DIAGNOSIS — M1711 Unilateral primary osteoarthritis, right knee: Secondary | ICD-10-CM

## 2020-05-22 MED ORDER — METHYLPREDNISOLONE ACETATE 40 MG/ML IJ SUSP
40.0000 mg | INTRAMUSCULAR | Status: AC | PRN
  Administered 2020-05-22: 40 mg via INTRA_ARTICULAR

## 2020-05-22 MED ORDER — LIDOCAINE HCL 1 % IJ SOLN
3.0000 mL | INTRAMUSCULAR | Status: AC | PRN
  Administered 2020-05-22: 3 mL

## 2020-05-22 NOTE — Progress Notes (Signed)
Office Visit Note   Patient: Jeanne [name]           Date of Birth: Sep 29, 1960           MRN: [id number] Visit Date: 05/22/2020              Requested by: Jonathon Bellows, PA-C [id number]  [place] [address] PCP: Jonathon Bellows, PA-C   Assessment & Plan: Visit Diagnoses:  1. Chronic pain of right knee   2. Unilateral primary osteoarthritis, right knee     Plan: I did talk to her in detail about her right knee.  My treatment plan would be considering a steroid injection today and then hyaluronic acid injection in the near future to try to decrease the pain from osteoarthritis in the knee.  I explained what exercises could help her as well as avoiding exercise walking the hills and keeping her treadmill flat.  All questions and concerns were answered and addressed.  She did tolerate the steroid injection well today.  We will see her back in 4 weeks to hopefully place hyaluronic acid into the right knee.  Follow-Up Instructions: Return in about 4 weeks (around 06/19/2020).   Orders:  No orders of the defined types were placed in this encounter.  No orders of the defined types were placed in this encounter.     Procedures: Large Joint Inj: R knee on 05/22/2020 9:23 AM Indications: diagnostic evaluation and pain Details: 22 G 1.5 in needle, superolateral approach  Arthrogram: No  Medications: 3 mL lidocaine 1 %; 40 mg methylPREDNISolone acetate 40 MG/ML Outcome: tolerated well, no immediate complications Procedure, treatment alternatives, risks and benefits explained, specific risks discussed. Consent was given by the patient. Immediately prior to procedure a time out was called to verify the correct patient, procedure, equipment, support staff and site/side marked as required. Patient was prepped and draped in the usual sterile fashion.       Clinical Data: No additional findings.   Subjective: Chief Complaint  Patient presents  with  . Right Knee - Follow-up  The patient comes in for evaluation treatment of chronic knee pain.  Her knee does swell on occasion.  There is medial lateral joint line tenderness and pain underneath the patella.  She does walk on a treadmill quite a bit.  She is never injured the knee before and is very active and then 59 year old female.  She is never had knee surgery.  She had x-rays in 2019 and then eventually MRI just last month of the right knee.  It showed significant arthritic changes in the knee as well as degenerative tearing of the lateral meniscus and some signal changes in the medial meniscus.  She is here today for further evaluation treatment of that knee.  She denies any locking catching at all.  Is more of an aching pain when she is down on her knee.  Sometimes it hurts even when she is not ambulating at all.  She is not a diabetic.  HPI  Review of Systems She currently denies any headache, chest pain, shortness of breath, fever, chills, nausea, vomiting  Objective: Vital Signs: LMP 07/01/2002   Physical Exam She is alert and orient x3 and in no acute distress Ortho Exam Examination of her right knee shows no effusion.  Her alignment is neutral.  Her range of motion is entirely full.  She has medial lateral joint line tenderness but a negative McMurray's or Lachman's exam.  Specialty Comments:  No specialty comments available.  Imaging: No results found. The MRI of her knee is independently reviewed.  There is signal changes in both meniscus and degenerative tearing of the lateral meniscus.  There is significant subchondral edema on the lateral compartment of her knee.  There is areas of significant cartilage loss as well and at least moderate thinning of the patellofemoral joint and mild thinning medially.  PMFS History: Patient Active Problem List   Diagnosis Date Noted  . Unilateral primary osteoarthritis, right knee 05/22/2020  . Major depressive disorder, recurrent  episode, mild ([place]) 05/09/2018  . Insomnia 05/09/2018  . Depression 12/04/2011  . Anxiety 12/04/2011  . Hyperprolactinemia ([place])    Past Medical History:  Diagnosis Date  . Acid reflux   . Anxiety   . Back pain   . Depression   . Elevated cholesterol   . HSV (herpes simplex virus) anogenital infection   . Hx gestational diabetes   . Hyperprolactinemia ([place])    valued 21 with normal MRI 2012  . Osteopenia 06/2019   T score -1.1 stable from prior DEXA FRAX 11% / 0.3%    Family History  Problem Relation Age of Onset  . Hypertension Mother   . Depression Mother   . Heart disease Father   . Hypertension Father   . Heart attack Father 56       heart attack, died  . Alcohol abuse Father   . Diabetes Maternal Grandmother   . Cancer Maternal Grandmother        colon  . Colon cancer Maternal Grandmother 39  . Anxiety disorder Maternal Grandmother   . Depression Maternal Grandmother   . Diabetes Maternal Grandfather   . Drug abuse Son   . Drug abuse Son   . Drug abuse Son     Past Surgical History:  Procedure Laterality Date  . BREAST SURGERY  12/2013   Reduction  . BUNIONECTOMY    . CERVICAL DISC SURGERY    . right elbow surgery    . right foot surgery     Social History   Occupational History  . Not on file  Tobacco Use  . Smoking status: Never Smoker  . Smokeless tobacco: Never Used  Vaping Use  . Vaping Use: Never used  Substance and Sexual Activity  . Alcohol use: Yes    Alcohol/week: 0.0 standard drinks    Comment: Rare  . Drug use: No  . Sexual activity: Yes    Birth control/protection: Post-menopausal, Surgical    Comment: vasectomy-1st intercourse 64 yo-5 partners

## 2020-05-22 NOTE — Telephone Encounter (Signed)
Right knee gel injection  

## 2020-05-22 NOTE — Telephone Encounter (Signed)
Submitted for VOB for Synvisc One-Right knee 

## 2020-05-23 ENCOUNTER — Other Ambulatory Visit: Payer: Self-pay

## 2020-05-23 ENCOUNTER — Encounter: Payer: Self-pay | Admitting: Obstetrics and Gynecology

## 2020-05-23 ENCOUNTER — Ambulatory Visit (INDEPENDENT_AMBULATORY_CARE_PROVIDER_SITE_OTHER): Payer: Commercial Managed Care - PPO | Admitting: Obstetrics and Gynecology

## 2020-05-23 VITALS — BP 122/78 | Ht 62.5 in | Wt 135.0 lb

## 2020-05-23 DIAGNOSIS — Z01419 Encounter for gynecological examination (general) (routine) without abnormal findings: Secondary | ICD-10-CM | POA: Diagnosis not present

## 2020-05-23 MED ORDER — VALACYCLOVIR HCL 500 MG PO TABS: 500.0000 mg | ORAL_TABLET | Freq: Two times a day (BID) | ORAL | 1 refills | Status: AC | PRN

## 2020-05-23 NOTE — Progress Notes (Signed)
Jeanne [name] [birth year] [id number]  SUBJECTIVE:  59 y.o. [id number] female for annual routine gynecologic exam. She has no gynecologic concerns.  Current Outpatient Medications  Medication Sig Dispense Refill  . alendronate (FOSAMAX) 70 MG tablet Take by mouth.    Marland Kitchen amitriptyline (ELAVIL) 10 MG tablet Take 4 tablets (40 mg total) by mouth at bedtime. 360 tablet 1  . atorvastatin (LIPITOR) 20 MG tablet Take 20 mg by mouth daily.     Marland Kitchen buPROPion (WELLBUTRIN XL) 300 MG 24 hr tablet Take 1 tablet (300 mg total) by mouth every morning. 90 tablet 1  . Calcium Carbonate-Vitamin D (CALCIUM + D PO) Take 1 tablet by mouth 2 (two) times daily.     . DULoxetine (CYMBALTA) 30 MG capsule TAKE ONE CAPSULE BY MOUTH DAILY TAKE WITH 60 MG CAPSULE TO EQUAL A TOTAL DOSE OF 90 MG 30 capsule 5  . DULoxetine (CYMBALTA) 60 MG capsule Take 1 capsule (60 mg total) by mouth every morning. Take with 30 mg capsule to equal 90 mg qd 30 capsule 5  . gabapentin (NEURONTIN) 800 MG tablet Take 1,200 mg by mouth 3 (three) times daily.     . methylphenidate (RITALIN) 20 MG tablet Take 1 tablet (20 mg total) by mouth every morning AND 0.5 tablets (10 mg total) daily at 12 noon. 135 tablet 0  . perphenazine (TRILAFON) 2 MG tablet Take 1 tablet (2 mg total) by mouth at bedtime. 90 tablet 1  . Polyethyl Glycol-Propyl Glycol (LUBRICANT EYE DROPS) 0.4-0.3 % SOLN Place 1 drop into both eyes 3 (three) times daily as needed (dry/irritated eyes.).    Marland Kitchen polyethylene glycol powder (MIRALAX) powder Take 17 g by mouth at bedtime.     . valACYclovir (VALTREX) 500 MG tablet Take 1 tablet (500 mg total) by mouth 2 (two) times daily. (Patient taking differently: Take 500 mg by mouth 2 (two) times daily as needed (outbreaks). ) 20 tablet 1  . zolpidem (AMBIEN) 10 MG tablet TAKE 1 AND 1/2 TABLET BY MOUTH EVERY NIGHT AT BEDTIME 135 tablet 0  . omeprazole (PRILOSEC) 20 MG capsule Take 20 mg by mouth daily as needed (heartburn/indigestion.).  (Patient  not taking: Reported on 05/23/2020)     No current facility-administered medications for this visit.   Allergies: Lamictal [lamotrigine]  Patient's last menstrual period was 07/01/2002.  Past medical history,surgical history, problem list, medications, allergies, family history and social history were all reviewed and documented as reviewed in the EPIC chart.  ROS: Pertinent positives and negatives as reviewed in HPI   OBJECTIVE:  BP 122/78   Ht 5' 2.5" (1.588 m)   Wt 135 lb (61.2 kg)   LMP 07/01/2002   BMI 24.30 kg/m  The patient appears well, alert, oriented, in no distress. ENT normal.  Neck supple. No cervical or supraclavicular adenopathy or thyromegaly.  Lungs are clear, good air entry, no wheezes, rhonchi or rales. S1 and S2 normal, no murmurs, regular rate and rhythm.  Abdomen soft without tenderness, guarding, mass or organomegaly.  Neurological is normal, no focal findings.  BREAST EXAM: breasts appear normal, no suspicious masses, no skin or nipple changes or axillary nodes  PELVIC EXAM: VULVA: normal appearing vulva with atrophic changes, no masses, tenderness or lesions, VAGINA: normal appearing vagina with atrophic changes, normal color and discharge, no lesions, CERVIX: normal appearing cervix without discharge or lesions, UTERUS: uterus is normal size, shape, consistency and nontender, ADNEXA: normal adnexa in size, nontender and no masses  Chaperone: Maudie Mercury  Alcario Drought present during the examination  ASSESSMENT:  60 y.o. [id number] here for annual gynecologic exam  PLAN:   1. Postmenopausal.  No significant menopausal symptoms.  No vaginal bleeding. 2.  History of hyperprolactinemia.  Per Dr. Loetta Rough note, normal MRI in the past and prolactin levels and follow-up were normal.  No current galactorrhea, headaches, or visual changes, if those symptoms were to develop we would then investigate further by rechecking a prolactin level at that point. 3. Pap smear/HPV 2020.  No  significant history of abnormal Pap smears, one in the past which resolved on its own.  Next Pap smear due 2025 following the current guidelines recommending the 5 year interval.  Discussed the current screening guidelines. 4.  History of rare HSV outbreak.  She uses episodic treatment with Valtrex 500 mg twice daily for up to 10 days as needed.  #20 with 1 refill provided.  She has not had to use this in a long time. 5. Mammogram 11/2019.  Normal breast exam today.  She will continue with annual mammograms. 6. Colonoscopy 2013.  She will follow up at the recommended interval.  7. Osteopenia.  DEXA 06/2019.  T-score -1.3, FRAX 11% / 0.3%, below threshold to treat.  Next DEXA recommended another 1 to 2 years. 8. Health maintenance.  No labs today as she normally has these completed elsewhere.  Return annually or sooner, prn.  Joseph Pierini MD 05/23/20

## 2020-06-05 ENCOUNTER — Telehealth: Payer: Self-pay

## 2020-06-05 NOTE — Telephone Encounter (Signed)
Submitted for VOB for Durolane-Right knee Buy and Bill Dr. Ninfa Linden Covered @ 100% No copay No prior auth required

## 2020-06-05 NOTE — Telephone Encounter (Signed)
Pt has appt on 06/19/20

## 2020-06-19 ENCOUNTER — Ambulatory Visit: Payer: Commercial Managed Care - PPO | Admitting: Orthopaedic Surgery

## 2020-07-14 ENCOUNTER — Ambulatory Visit (INDEPENDENT_AMBULATORY_CARE_PROVIDER_SITE_OTHER): Payer: Commercial Managed Care - PPO | Admitting: Psychiatry

## 2020-07-14 ENCOUNTER — Encounter: Payer: Self-pay | Admitting: Psychiatry

## 2020-07-14 ENCOUNTER — Other Ambulatory Visit: Payer: Self-pay

## 2020-07-14 DIAGNOSIS — G4700 Insomnia, unspecified: Secondary | ICD-10-CM | POA: Diagnosis not present

## 2020-07-14 DIAGNOSIS — F3342 Major depressive disorder, recurrent, in full remission: Secondary | ICD-10-CM

## 2020-07-14 DIAGNOSIS — F900 Attention-deficit hyperactivity disorder, predominantly inattentive type: Secondary | ICD-10-CM

## 2020-07-14 MED ORDER — PERPHENAZINE 2 MG PO TABS: 2.0000 mg | ORAL_TABLET | Freq: Every day | ORAL | 1 refills | Status: DC

## 2020-07-14 MED ORDER — DULOXETINE HCL 30 MG PO CPEP: ORAL_CAPSULE | ORAL | 1 refills | Status: DC

## 2020-07-14 MED ORDER — BUPROPION HCL ER (XL) 300 MG PO TB24: 300.0000 mg | ORAL_TABLET | Freq: Every morning | ORAL | 1 refills | Status: DC

## 2020-07-14 MED ORDER — ZOLPIDEM TARTRATE 10 MG PO TABS: ORAL_TABLET | ORAL | 0 refills | Status: DC

## 2020-07-14 MED ORDER — DULOXETINE HCL 60 MG PO CPEP: 60.0000 mg | ORAL_CAPSULE | ORAL | 1 refills | Status: DC

## 2020-07-14 MED ORDER — METHYLPHENIDATE HCL 20 MG PO TABS: ORAL_TABLET | ORAL | 0 refills | Status: DC

## 2020-07-14 MED ORDER — AMITRIPTYLINE HCL 10 MG PO TABS: 40.0000 mg | ORAL_TABLET | Freq: Every day | ORAL | 1 refills | Status: DC

## 2020-07-14 NOTE — Progress Notes (Signed)
Jeanne [name] [id number] [birth year] 60 y.o.  Subjective:   Patient ID:  Jeanne [name] is a 60 y.o. (DOB 1960/12/22) female.  Chief Complaint:  Chief Complaint  Patient presents with  . Follow-up    H/o anxiety, depression, insomnia, and ADHD    HPI Rohini Jaroszewski Houdek presents to the office today for follow-up of depression, anxiety, insomnia, and ADHD. Denies any concerns today. Mood has been "good." She reports that she has some depression. Denies anxiety. She reports that she is "worn out on the weekend" and that her energy and motivation is adequate during the school week. Sleeping well. Appetite has been ok. Denies difficulty with concentration or focus. Denies SI.   Has celebrated Christmas on several occasions with different parts of the family and with family that has been sick.    Past medication trials: Prozac-took for approximately 30 to 40 years Zoloft Wellbutrin Amitriptyline Perphenazine Methylphenidate Adderall Abilify Lamictal-itching Cymbalta [place] Office Visit from 04/13/2020 in [place] Office Visit from 09/21/2019 in [place] Visit from 06/01/2019 in [place] Total Score 1 1 0       Review of Systems:  Review of Systems  Musculoskeletal: Negative for gait problem.  Neurological: Negative for tremors.       Sensation of tingling on her scalp as if something is crawling. She reports that gabapentin is helpful.   Psychiatric/Behavioral:       Please refer to HPI    Medications: I have reviewed the patient's current medications.  Current Outpatient Medications  Medication Sig Dispense Refill  . alendronate (FOSAMAX) 70 MG tablet Take by mouth.    Marland Kitchen atorvastatin (LIPITOR) 20 MG tablet Take 20 mg by mouth daily.     . Calcium Carbonate-Vitamin D (CALCIUM + D PO) Take 1 tablet by mouth 2 (two) times daily.     Marland Kitchen gabapentin (NEURONTIN) 800 MG tablet  Take 1,200 mg by mouth 3 (three) times daily.     Vladimir Faster Glycol-Propyl Glycol (LUBRICANT EYE DROPS) 0.4-0.3 % SOLN Place 1 drop into both eyes 3 (three) times daily as needed (dry/irritated eyes.).    Marland Kitchen polyethylene glycol powder (GLYCOLAX/MIRALAX) 17 GM/SCOOP powder Take 17 g by mouth at bedtime.     Marland Kitchen amitriptyline (ELAVIL) 10 MG tablet Take 4 tablets (40 mg total) by mouth at bedtime. 360 tablet 1  . buPROPion (WELLBUTRIN XL) 300 MG 24 hr tablet Take 1 tablet (300 mg total) by mouth every morning. 90 tablet 1  . DULoxetine (CYMBALTA) 30 MG capsule TAKE ONE CAPSULE BY MOUTH DAILY TAKE WITH 60 MG CAPSULE TO EQUAL A TOTAL DOSE OF 90 MG 90 capsule 1  . DULoxetine (CYMBALTA) 60 MG capsule Take 1 capsule (60 mg total) by mouth every morning. Take with 30 mg capsule to equal 90 mg qd 90 capsule 1  . [START ON 07/27/2020] methylphenidate (RITALIN) 20 MG tablet Take 1 tablet (20 mg total) by mouth every morning AND 0.5 tablets (10 mg total) daily at 12 noon. 135 tablet 0  . omeprazole (PRILOSEC) 20 MG capsule Take 20 mg by mouth daily as needed (heartburn/indigestion.).  (Patient not taking: Reported on 05/23/2020)    . perphenazine (TRILAFON) 2 MG tablet Take 1 tablet (2 mg total) by mouth at bedtime. 90 tablet 1  . [START ON 07/27/2020] zolpidem (AMBIEN) 10 MG tablet TAKE 1 AND 1/2 TABLET BY MOUTH EVERY NIGHT AT BEDTIME 135 tablet 0  No current facility-administered medications for this visit.    Medication Side Effects: Other: Dry mouth  Allergies:  Allergies  Allergen Reactions  . Lamictal [Lamotrigine] Itching    Past Medical History:  Diagnosis Date  . Acid reflux   . Anxiety   . Back pain   . Depression   . Elevated cholesterol   . HSV (herpes simplex virus) anogenital infection   . Hx gestational diabetes   . Hyperprolactinemia (HCC)    valued 47 with normal MRI 2012  . Osteopenia 06/2019   T score -1.1 stable from prior DEXA FRAX 11% / 0.3%  . Osteoporosis     Family  History  Problem Relation Age of Onset  . Hypertension Mother   . Depression Mother   . Heart disease Father   . Hypertension Father   . Heart attack Father 45       heart attack, died  . Alcohol abuse Father   . Diabetes Maternal Grandmother   . Cancer Maternal Grandmother        colon  . Colon cancer Maternal Grandmother 40  . Anxiety disorder Maternal Grandmother   . Depression Maternal Grandmother   . Diabetes Maternal Grandfather   . Drug abuse Son   . Drug abuse Son   . Drug abuse Son     Social History   Socioeconomic History  . Marital status: Married    Spouse name: Not on file  . Number of children: Not on file  . Years of education: Not on file  . Highest education level: Not on file  Occupational History  . Not on file  Tobacco Use  . Smoking status: Never Smoker  . Smokeless tobacco: Never Used  Vaping Use  . Vaping Use: Never used  Substance and Sexual Activity  . Alcohol use: Yes    Alcohol/week: 0.0 standard drinks    Comment: Rare  . Drug use: No  . Sexual activity: Yes    Birth control/protection: Post-menopausal, Surgical    Comment: vasectomy-1st intercourse 12 yo-5 partners  Other Topics Concern  . Not on file  Social History Narrative  . Not on file   Social Determinants of Health   Financial Resource Strain: Not on file  Food Insecurity: Not on file  Transportation Needs: Not on file  Physical Activity: Not on file  Stress: Not on file  Social Connections: Not on file  Intimate Partner Violence: Not on file    Past Medical History, Surgical history, Social history, and Family history were reviewed and updated as appropriate.   Please see review of systems for further details on the patient's review from today.   Objective:   Physical Exam:  BP (!) 129/94   Pulse 87   LMP 07/01/2002   Physical Exam Constitutional:      General: She is not in acute distress. Musculoskeletal:        General: No deformity.  Neurological:      Mental Status: She is alert and oriented to person, place, and time.     Coordination: Coordination normal.  Psychiatric:        Attention and Perception: Attention and perception normal. She does not perceive auditory or visual hallucinations.        Mood and Affect: Mood normal. Mood is not anxious or depressed. Affect is not labile, blunt, angry or inappropriate.        Speech: Speech normal.        Behavior: Behavior normal.  Thought Content: Thought content normal. Thought content is not paranoid or delusional. Thought content does not include homicidal or suicidal ideation. Thought content does not include homicidal or suicidal plan.        Cognition and Memory: Cognition and memory normal.        Judgment: Judgment normal.     Comments: Insight intact     Lab Review:     Component Value Date/Time   NA 134 (L) 07/05/2019 1436   K 4.6 07/05/2019 1436   CL 97 (L) 07/05/2019 1436   CO2 30 07/05/2019 1436   GLUCOSE 87 07/05/2019 1436   BUN 21 (H) 07/05/2019 1436   CREATININE 0.93 07/05/2019 1436   CREATININE 0.97 02/11/2017 0838   CALCIUM 9.1 07/05/2019 1436   PROT 6.6 02/11/2017 0838   ALBUMIN 4.4 02/11/2017 0838   AST 19 02/11/2017 0838   ALT 18 02/11/2017 0838   ALKPHOS 68 02/11/2017 0838   BILITOT 0.4 02/11/2017 0838   GFRNONAA >60 07/05/2019 1436   GFRAA >60 07/05/2019 1436       Component Value Date/Time   WBC 6.0 07/05/2019 1436   RBC 4.22 07/05/2019 1436   HGB 12.7 07/05/2019 1436   HCT 39.6 07/05/2019 1436   PLT 281 07/05/2019 1436   MCV 93.8 07/05/2019 1436   MCH 30.1 07/05/2019 1436   MCHC 32.1 07/05/2019 1436   RDW 13.1 07/05/2019 1436   LYMPHSABS 1.4 02/03/2015 0943   MONOABS 0.4 02/03/2015 0943   EOSABS 0.1 02/03/2015 0943   BASOSABS 0.1 02/03/2015 0943    No results found for: POCLITH, LITHIUM   No results found for: PHENYTOIN, PHENOBARB, VALPROATE, CBMZ   .res Assessment: Plan:   Will continue current plan of care since target  signs and symptoms are well controlled without any tolerability issues. Pt to follow-up in 3 months or sooner if clinically indicated.  Patient advised to contact office with any questions, adverse effects, or acute worsening in signs and symptoms.  Mette was seen today for follow-up.  Diagnoses and all orders for this visit:  Insomnia, unspecified type -     amitriptyline (ELAVIL) 10 MG tablet; Take 4 tablets (40 mg total) by mouth at bedtime. -     zolpidem (AMBIEN) 10 MG tablet; TAKE 1 AND 1/2 TABLET BY MOUTH EVERY NIGHT AT BEDTIME  Recurrent major depressive disorder, in full remission (HCC) -     amitriptyline (ELAVIL) 10 MG tablet; Take 4 tablets (40 mg total) by mouth at bedtime. -     buPROPion (WELLBUTRIN XL) 300 MG 24 hr tablet; Take 1 tablet (300 mg total) by mouth every morning. -     DULoxetine (CYMBALTA) 30 MG capsule; TAKE ONE CAPSULE BY MOUTH DAILY TAKE WITH 60 MG CAPSULE TO EQUAL A TOTAL DOSE OF 90 MG -     DULoxetine (CYMBALTA) 60 MG capsule; Take 1 capsule (60 mg total) by mouth every morning. Take with 30 mg capsule to equal 90 mg qd -     perphenazine (TRILAFON) 2 MG tablet; Take 1 tablet (2 mg total) by mouth at bedtime.  Attention deficit hyperactivity disorder (ADHD), predominantly inattentive type -     methylphenidate (RITALIN) 20 MG tablet; Take 1 tablet (20 mg total) by mouth every morning AND 0.5 tablets (10 mg total) daily at 12 noon.     Please see After Visit Summary for patient specific instructions.  Future Appointments  Date Time Provider [place]  09/27/2020  8:30 AM Thayer Headings, PMHNP  CP-CP None    No orders of the defined types were placed in this encounter.   -------------------------------

## 2020-09-27 ENCOUNTER — Other Ambulatory Visit: Payer: Self-pay

## 2020-09-27 ENCOUNTER — Ambulatory Visit: Payer: Commercial Managed Care - PPO | Admitting: Psychiatry

## 2020-09-27 ENCOUNTER — Encounter: Payer: Self-pay | Admitting: Psychiatry

## 2020-09-27 DIAGNOSIS — F900 Attention-deficit hyperactivity disorder, predominantly inattentive type: Secondary | ICD-10-CM

## 2020-09-27 DIAGNOSIS — F3342 Major depressive disorder, recurrent, in full remission: Secondary | ICD-10-CM

## 2020-09-27 DIAGNOSIS — G4700 Insomnia, unspecified: Secondary | ICD-10-CM | POA: Diagnosis not present

## 2020-09-27 MED ORDER — BUPROPION HCL ER (XL) 300 MG PO TB24
300.0000 mg | ORAL_TABLET | Freq: Every morning | ORAL | 1 refills | Status: DC
Start: 2020-09-27 — End: 2021-03-30

## 2020-09-27 MED ORDER — AMITRIPTYLINE HCL 10 MG PO TABS: 40.0000 mg | ORAL_TABLET | Freq: Every day | ORAL | 1 refills | Status: DC

## 2020-09-27 MED ORDER — DULOXETINE HCL 30 MG PO CPEP: ORAL_CAPSULE | ORAL | 1 refills | Status: DC

## 2020-09-27 MED ORDER — ZOLPIDEM TARTRATE 10 MG PO TABS: ORAL_TABLET | ORAL | 1 refills | Status: DC

## 2020-09-27 MED ORDER — METHYLPHENIDATE HCL 20 MG PO TABS: ORAL_TABLET | ORAL | 0 refills | Status: DC

## 2020-09-27 MED ORDER — DULOXETINE HCL 60 MG PO CPEP: 60.0000 mg | ORAL_CAPSULE | ORAL | 1 refills | Status: DC

## 2020-09-27 MED ORDER — PERPHENAZINE 2 MG PO TABS: 2.0000 mg | ORAL_TABLET | Freq: Every day | ORAL | 1 refills | Status: DC

## 2020-09-27 MED ORDER — ZOLPIDEM TARTRATE 10 MG PO TABS: ORAL_TABLET | ORAL | 0 refills | Status: DC

## 2020-09-27 NOTE — Progress Notes (Signed)
Jeanne [name] [id number] [birth year] 60 y.o.  Subjective:   Patient ID:  Jeanne [name] is a 60 y.o. (DOB 06/19/1961) female.  Chief Complaint:  Chief Complaint  Patient presents with  . Follow-up    Anxiety, depression, insomnia, and ADHD    HPI [name] presents to the office today for follow-up of depression, insomnia, and ADHD. She reports that she is on spring break and that her mood has been improved while out of work. She reports that her mood is somewhat sad at times. "I think I am as good as I am going to get." Denies any significant anxiety. Denies any changes with sleep. Appetite is good. Energy and motivation have been good. Has been enjoying her break and has been going shopping, having lunch with friends, etc. Concentration has been adequate. Denies SI.   She is going to pick up her middle son to go see her oldest son. Reports that middle son, Darnelle Maffucci, had a recent set back and is now doing well.   Reports that husband is very supportive.    AIMS   Flowsheet Row Office Visit from 04/13/2020 in [place] Office Visit from 09/21/2019 in [place] Visit from 06/01/2019 in [facility]  AIMS Total Score 1 1 0       Review of Systems:  Review of Systems  Cardiovascular: Negative for palpitations.  Musculoskeletal: Negative for gait problem.  Neurological: Negative for tremors.  Psychiatric/Behavioral:       Please refer to HPI    Medications: I have reviewed the patient's current medications.  Current Outpatient Medications  Medication Sig Dispense Refill  . alendronate (FOSAMAX) 70 MG tablet Take by mouth.    Marland Kitchen atorvastatin (LIPITOR) 20 MG tablet Take 20 mg by mouth daily.     . Calcium Carbonate-Vitamin D (CALCIUM + D PO) Take 1 tablet by mouth 2 (two) times daily.     Marland Kitchen GABAPENTIN PO Take 900 mg by mouth 2 (two) times daily.    Vladimir Faster Glycol-Propyl Glycol (LUBRICANT EYE DROPS) 0.4-0.3 % SOLN  Place 1 drop into both eyes 3 (three) times daily as needed (dry/irritated eyes.).    Marland Kitchen polyethylene glycol powder (GLYCOLAX/MIRALAX) 17 GM/SCOOP powder Take 17 g by mouth at bedtime.     Marland Kitchen amitriptyline (ELAVIL) 10 MG tablet Take 4 tablets (40 mg total) by mouth at bedtime. 360 tablet 1  . buPROPion (WELLBUTRIN XL) 300 MG 24 hr tablet Take 1 tablet (300 mg total) by mouth every morning. 90 tablet 1  . DULoxetine (CYMBALTA) 30 MG capsule TAKE ONE CAPSULE BY MOUTH DAILY TAKE WITH 60 MG CAPSULE TO EQUAL A TOTAL DOSE OF 90 MG 90 capsule 1  . DULoxetine (CYMBALTA) 60 MG capsule Take 1 capsule (60 mg total) by mouth every morning. Take with 30 mg capsule to equal 90 mg qd 90 capsule 1  . [START ON 10/23/2020] methylphenidate (RITALIN) 20 MG tablet Take 1 tablet (20 mg total) by mouth every morning AND 0.5 tablets (10 mg total) daily at 12 noon. 135 tablet 0  . omeprazole (PRILOSEC) 20 MG capsule Take 20 mg by mouth daily as needed (heartburn/indigestion.).  (Patient not taking: Reported on 05/23/2020)    . perphenazine (TRILAFON) 2 MG tablet Take 1 tablet (2 mg total) by mouth at bedtime. 90 tablet 1  . [START ON 10/23/2020] zolpidem (AMBIEN) 10 MG tablet TAKE 1 AND 1/2 TABLET BY MOUTH EVERY NIGHT AT BEDTIME 135 tablet 1  No current facility-administered medications for this visit.    Medication Side Effects: Other: Dry mouth  Allergies:  Allergies  Allergen Reactions  . Lamictal [Lamotrigine] Itching    Past Medical History:  Diagnosis Date  . Acid reflux   . Anxiety   . Back pain   . Depression   . Elevated cholesterol   . HSV (herpes simplex virus) anogenital infection   . Hx gestational diabetes   . Hyperprolactinemia ([place])    valued 81 with normal MRI 2012  . Osteopenia 06/2019   T score -1.1 stable from prior DEXA FRAX 11% / 0.3%  . Osteoporosis     Family History  Problem Relation Age of Onset  . Hypertension Mother   . Depression Mother   . Heart disease Father   .  Hypertension Father   . Heart attack Father 14       heart attack, died  . Alcohol abuse Father   . Diabetes Maternal Grandmother   . Cancer Maternal Grandmother        colon  . Colon cancer Maternal Grandmother 101  . Anxiety disorder Maternal Grandmother   . Depression Maternal Grandmother   . Diabetes Maternal Grandfather   . Drug abuse Son   . Drug abuse Son   . Drug abuse Son     Social History   Socioeconomic History  . Marital status: Married    Spouse name: Not on file  . Number of children: Not on file  . Years of education: Not on file  . Highest education level: Not on file  Occupational History  . Not on file  Tobacco Use  . Smoking status: Never Smoker  . Smokeless tobacco: Never Used  Vaping Use  . Vaping Use: Never used  Substance and Sexual Activity  . Alcohol use: Yes    Alcohol/week: 0.0 standard drinks    Comment: Rare  . Drug use: No  . Sexual activity: Yes    Birth control/protection: Post-menopausal, Surgical    Comment: vasectomy-1st intercourse 12 yo-5 partners  Other Topics Concern  . Not on file  Social History Narrative  . Not on file   Social Determinants of Health   Financial Resource Strain: Not on file  Food Insecurity: Not on file  Transportation Needs: Not on file  Physical Activity: Not on file  Stress: Not on file  Social Connections: Not on file  Intimate Partner Violence: Not on file    Past Medical History, Surgical history, Social history, and Family history were reviewed and updated as appropriate.   Please see review of systems for further details on the patient's review from today.   Objective:   Physical Exam:  BP (!) 149/94   Pulse 88   LMP 07/01/2002   Physical Exam Constitutional:      General: She is not in acute distress. Musculoskeletal:        General: No deformity.  Neurological:     Mental Status: She is alert and oriented to person, place, and time.     Coordination: Coordination normal.   Psychiatric:        Attention and Perception: Attention and perception normal. She does not perceive auditory or visual hallucinations.        Mood and Affect: Mood normal. Mood is not anxious or depressed. Affect is not labile, blunt, angry or inappropriate.        Speech: Speech normal.        Behavior: Behavior normal.  Thought Content: Thought content normal. Thought content is not paranoid or delusional. Thought content does not include homicidal or suicidal ideation. Thought content does not include homicidal or suicidal plan.        Cognition and Memory: Cognition and memory normal.        Judgment: Judgment normal.     Comments: Insight intact     Lab Review:     Component Value Date/Time   NA 134 (L) 07/05/2019 1436   K 4.6 07/05/2019 1436   CL 97 (L) 07/05/2019 1436   CO2 30 07/05/2019 1436   GLUCOSE 87 07/05/2019 1436   BUN 21 (H) 07/05/2019 1436   CREATININE 0.93 07/05/2019 1436   CREATININE 0.97 02/11/2017 0838   CALCIUM 9.1 07/05/2019 1436   PROT 6.6 02/11/2017 0838   ALBUMIN 4.4 02/11/2017 0838   AST 19 02/11/2017 0838   ALT 18 02/11/2017 0838   ALKPHOS 68 02/11/2017 0838   BILITOT 0.4 02/11/2017 0838   GFRNONAA >60 07/05/2019 1436   GFRAA >60 07/05/2019 1436       Component Value Date/Time   WBC 6.0 07/05/2019 1436   RBC 4.22 07/05/2019 1436   HGB 12.7 07/05/2019 1436   HCT 39.6 07/05/2019 1436   PLT 281 07/05/2019 1436   MCV 93.8 07/05/2019 1436   MCH 30.1 07/05/2019 1436   MCHC 32.1 07/05/2019 1436   RDW 13.1 07/05/2019 1436   LYMPHSABS 1.4 02/03/2015 0943   MONOABS 0.4 02/03/2015 0943   EOSABS 0.1 02/03/2015 0943   BASOSABS 0.1 02/03/2015 0943    No results found for: POCLITH, LITHIUM   No results found for: PHENYTOIN, PHENOBARB, VALPROATE, CBMZ   .res Assessment: Plan:   Will continue current plan of care since target signs and symptoms are well controlled without any tolerability issues. Pt to follow-up in 6 months or sooner if  clinically indicated.  Requested pt call in 3 months to provide update and request additional scripts.  Patient advised to contact office with any questions, adverse effects, or acute worsening in signs and symptoms.  Skylynne was seen today for follow-up.  Diagnoses and all orders for this visit:  Insomnia, unspecified type -     amitriptyline (ELAVIL) 10 MG tablet; Take 4 tablets (40 mg total) by mouth at bedtime. -     Discontinue: zolpidem (AMBIEN) 10 MG tablet; TAKE 1 AND 1/2 TABLET BY MOUTH EVERY NIGHT AT BEDTIME -     zolpidem (AMBIEN) 10 MG tablet; TAKE 1 AND 1/2 TABLET BY MOUTH EVERY NIGHT AT BEDTIME  Recurrent major depressive disorder, in full remission (HCC) -     amitriptyline (ELAVIL) 10 MG tablet; Take 4 tablets (40 mg total) by mouth at bedtime. -     buPROPion (WELLBUTRIN XL) 300 MG 24 hr tablet; Take 1 tablet (300 mg total) by mouth every morning. -     DULoxetine (CYMBALTA) 30 MG capsule; TAKE ONE CAPSULE BY MOUTH DAILY TAKE WITH 60 MG CAPSULE TO EQUAL A TOTAL DOSE OF 90 MG -     DULoxetine (CYMBALTA) 60 MG capsule; Take 1 capsule (60 mg total) by mouth every morning. Take with 30 mg capsule to equal 90 mg qd -     perphenazine (TRILAFON) 2 MG tablet; Take 1 tablet (2 mg total) by mouth at bedtime.  Attention deficit hyperactivity disorder (ADHD), predominantly inattentive type -     methylphenidate (RITALIN) 20 MG tablet; Take 1 tablet (20 mg total) by mouth every morning AND 0.5 tablets (10  mg total) daily at 12 noon.     Please see After Visit Summary for patient specific instructions.  Future Appointments  Date Time Provider [place]  03/30/2021  9:00 AM Thayer Headings, PMHNP CP-CP None    No orders of the defined types were placed in this encounter.   -------------------------------

## 2021-01-09 ENCOUNTER — Telehealth: Payer: Self-pay | Admitting: Psychiatry

## 2021-01-09 DIAGNOSIS — F900 Attention-deficit hyperactivity disorder, predominantly inattentive type: Secondary | ICD-10-CM

## 2021-01-09 MED ORDER — METHYLPHENIDATE HCL 20 MG PO TABS: ORAL_TABLET | ORAL | 0 refills | Status: DC

## 2021-01-09 NOTE — Telephone Encounter (Signed)
Pt  left a message stating that she needs written /paper scripts of her methylphenidate and her zolpidem. Please call her when ready to pick up at [phone]

## 2021-01-09 NOTE — Telephone Encounter (Signed)
Called pt and told her script was ready for pick up

## 2021-01-09 NOTE — Telephone Encounter (Signed)
Ritalin script printed and left in office inbox. She should have a refill on Ambien.

## 2021-02-22 IMAGING — MR MR HIP*R* W/O CM
5 series · 35 of 40 positions shown · non-contrast
Comparison: None.

CLINICAL DATA: Right hip pain extending into the groin, thigh and
down the leg since December 2018.

EXAM:
MR OF THE RIGHT HIP WITHOUT CONTRAST
TECHNIQUE: Multiplanar, multisequence MR imaging was performed. No intravenous
contrast was administered.

[Series 8: T2 fat-sat · coronal · right · 3.0mm · 0.89mm/px · 7 of 30 slices shown (1 of 2)]
[im 1/30]
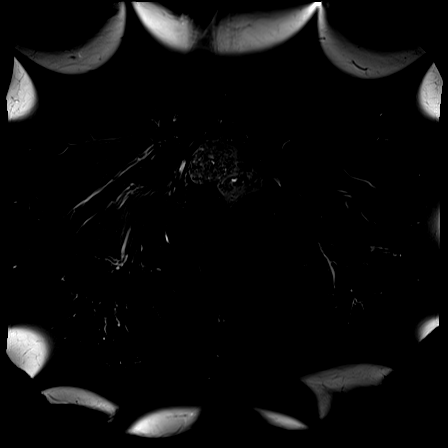
[im 5/30]
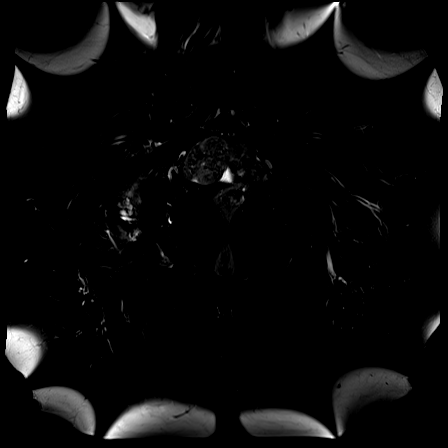
[im 10/30]
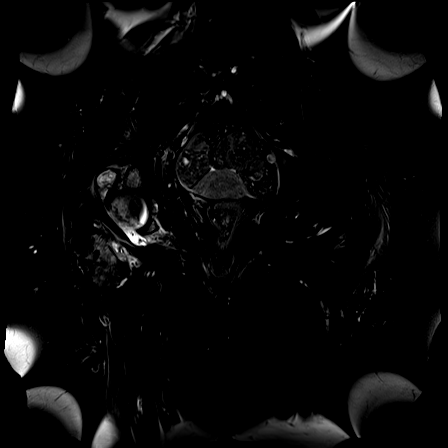
[im 15/30]
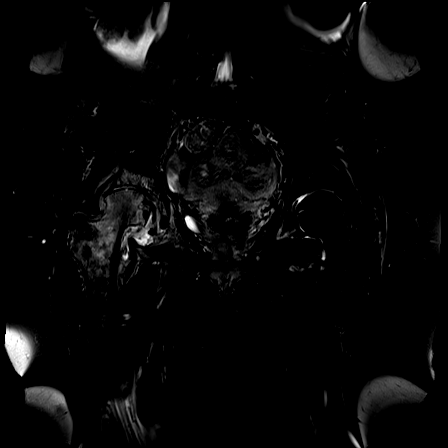
[im 20/30]
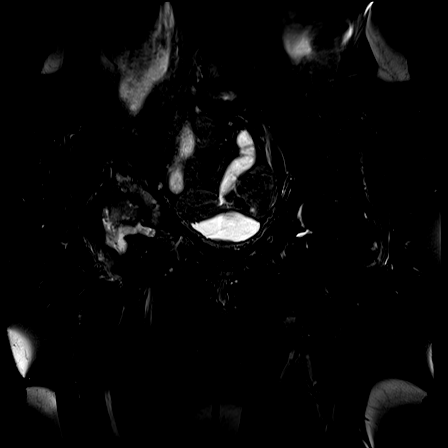
[im 25/30]
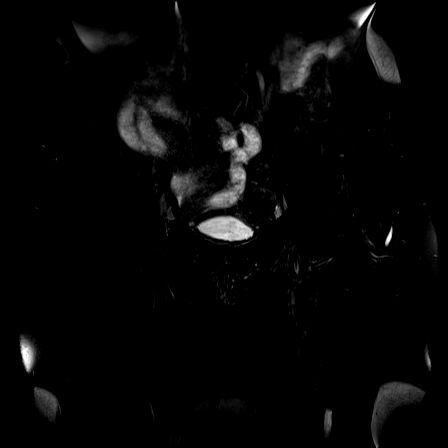
[im 30/30]
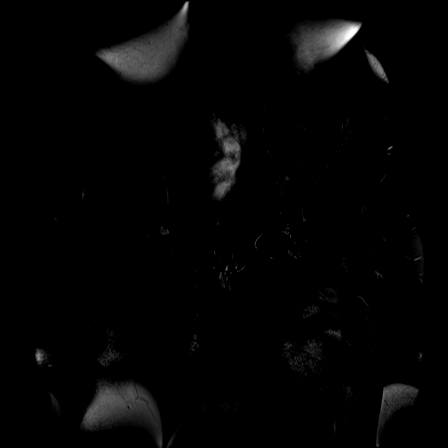

[Series 9: T1 · coronal · right · 3.0mm · 0.89mm/px · 3 of 30 slices shown]
[im 1/30]
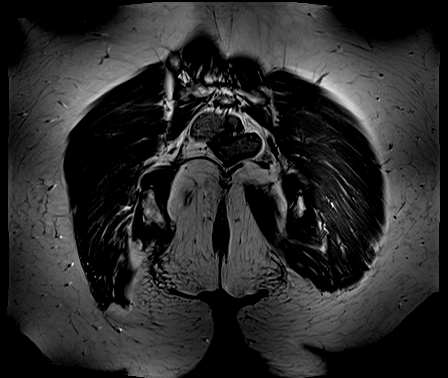
[im 5/30]
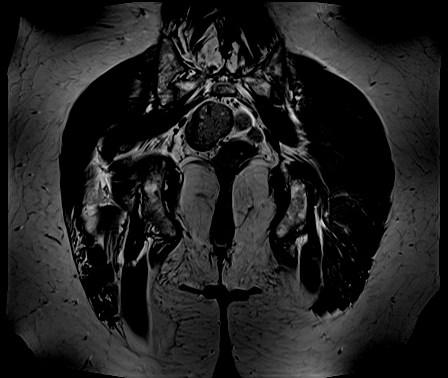
[im 9/30]
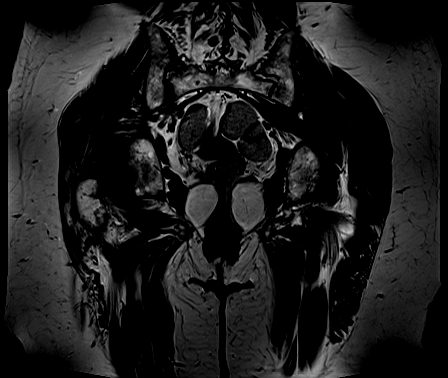

[Series 10: T2 fat-sat · axial · right · 3.0mm · 0.56mm/px · z∈[-120,+2]mm · 9 of 35 slices shown (2 of 2)]
[im 1/35]
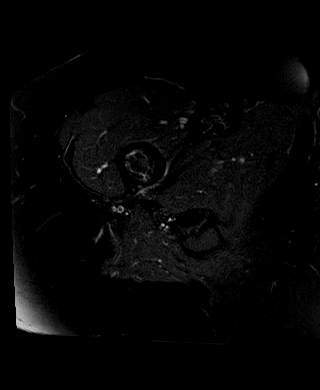
[im 5/35]
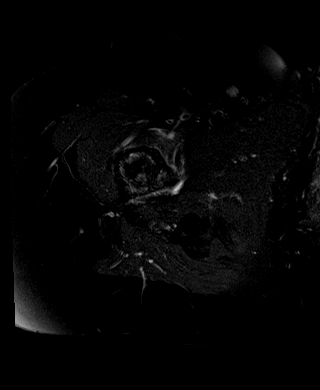
[im 9/35]
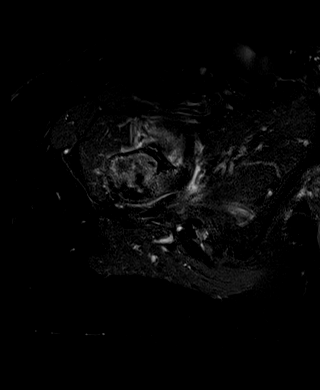
[im 13/35]
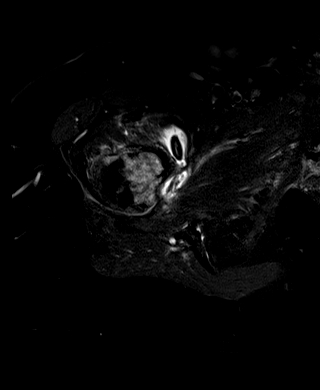
[im 18/35]
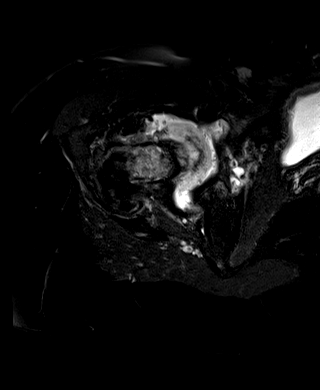
[im 22/35]
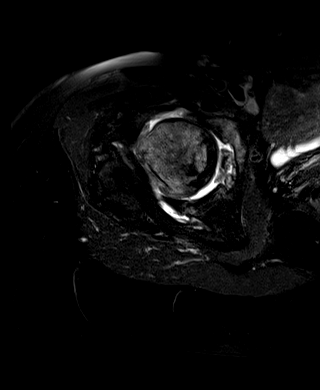
[im 26/35]
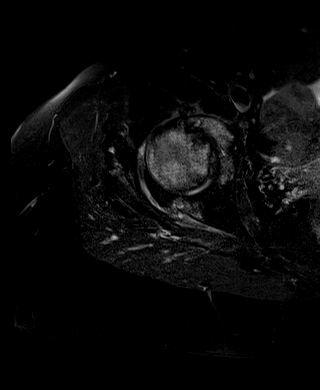
[im 30/35]
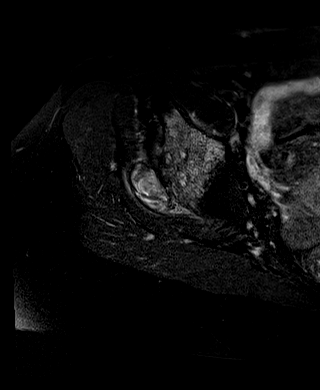
[im 35/35]
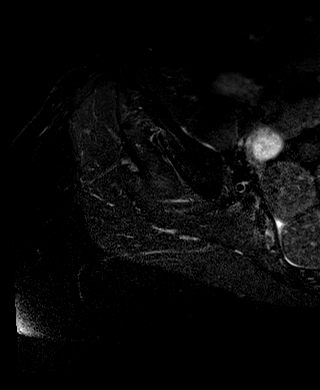

[Series 11: PD fat-sat · coronal · right · 3.0mm · 0.56mm/px · 7 of 28 slices shown (1 of 2)]
[im 1/28]
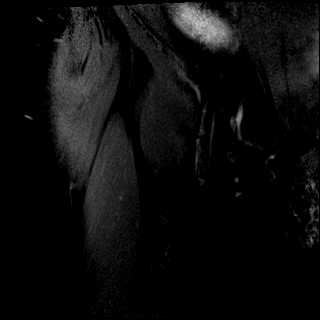
[im 5/28]
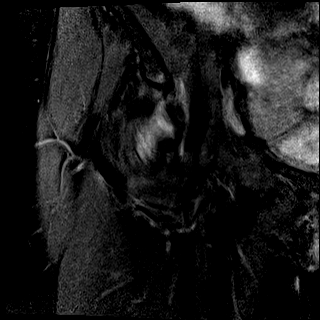
[im 10/28]
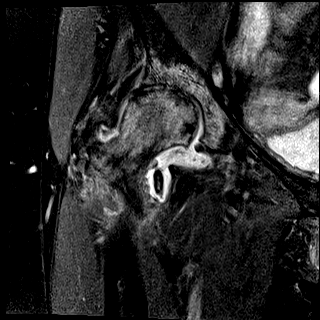
[im 14/28]
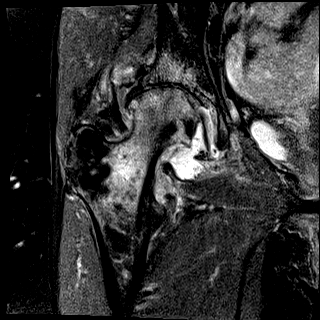
[im 19/28]
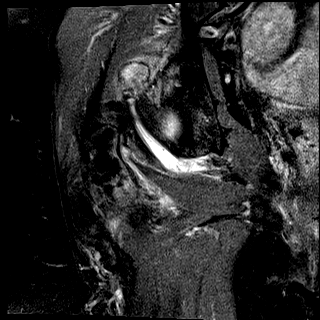
[im 23/28]
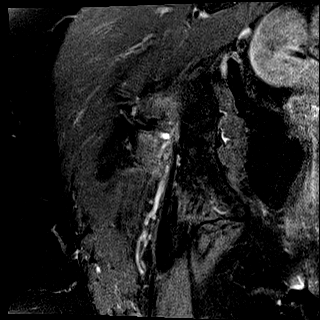
[im 28/28]
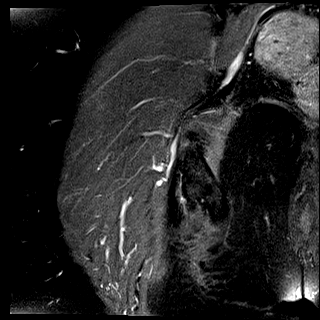

[Series 12: PD fat-sat · sagittal · right · 3.0mm · 0.56mm/px · 9 of 35 slices shown (2 of 2)]
[im 1/35]
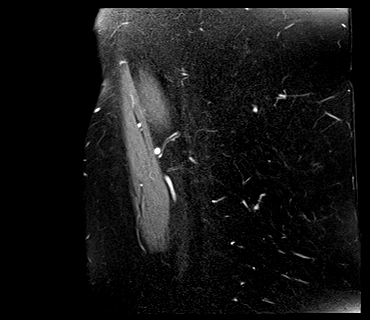
[im 5/35]
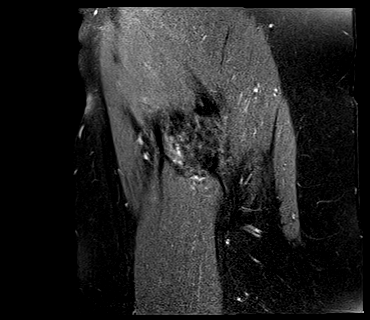
[im 9/35]
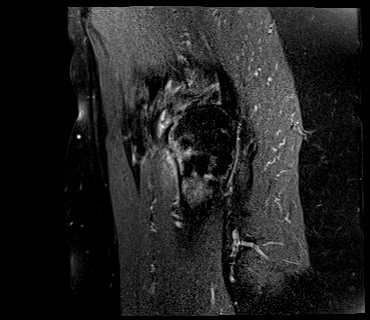
[im 13/35]
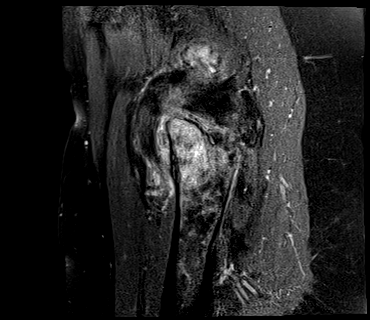
[im 18/35]
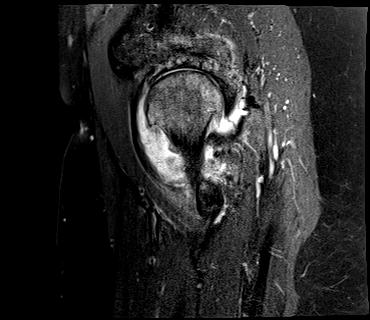
[im 22/35]
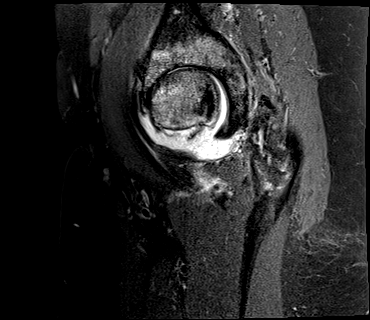
[im 26/35]
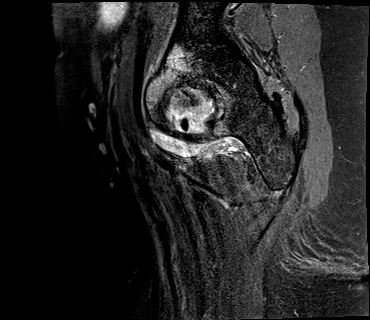
[im 30/35]
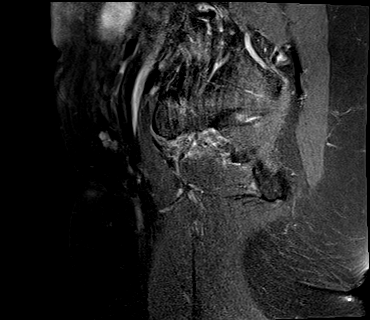
[im 35/35]
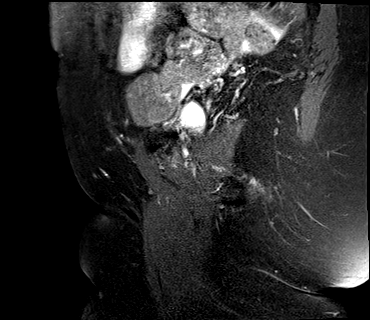

[35 of 40 positions shown; findings below may reference images not displayed]

FINDINGS: Bones: No hip fracture, dislocation or avascular necrosis. No
periosteal reaction or bone destruction. No aggressive osseous
lesion.

Normal sacrum and sacroiliac joints. No SI joint widening or erosive
changes.

Degenerative disc disease with disc height loss at L4-5 and L5-S1.
Bilateral facet arthropathy of the lower lumbar spine.

Articular cartilage and labrum

Articular cartilage: Extensive full-thickness cartilage loss of the
right femoral head and acetabulum with severe subchondral marrow
edema, marginal osteophytes and subchondral cystic changes. Mild
partial-thickness cartilage loss of the left hip.

Labrum: Diffuse right labral degeneration with a tear of the
superior labrum and large right superior paralabral cyst measuring
17 mm.

Joint or bursal effusion

Joint effusion: Large right hip joint effusion with severe synovitis
and a 12 mm loose body. Small left hip joint effusion. No SI joint
effusion.

Bursae:  No bursa formation.

Muscles and tendons

Flexors: Normal.

Extensors: Normal.

Abductors: Normal.

Adductors: Normal.

Gluteals: Small partial tear of the right gluteus minimus tendon
insertion.

Hamstrings: Small partial tear of the hamstring origins bilaterally.

Other findings

Miscellaneous: No pelvic free fluid. No fluid collection or
hematoma. No inguinal lymphadenopathy. No inguinal hernia.
IMPRESSION: 1. Severe advanced osteoarthritis of the right hip with extensive
subchondral marrow edema, large joint effusion with severe synovitis
and a 12 mm loose body. Diffuse right labral degeneration with a
superior labral tear and a large paralabral cyst.
2. Mild osteoarthritis of the left hip.
3. Small partial tear of the right gluteus minimus tendon insertion.
4. Partial thickness tear of the hamstring origin bilaterally.

## 2021-03-30 ENCOUNTER — Encounter: Payer: Self-pay | Admitting: Psychiatry

## 2021-03-30 ENCOUNTER — Ambulatory Visit (INDEPENDENT_AMBULATORY_CARE_PROVIDER_SITE_OTHER): Payer: Commercial Managed Care - PPO | Admitting: Psychiatry

## 2021-03-30 DIAGNOSIS — G4700 Insomnia, unspecified: Secondary | ICD-10-CM

## 2021-03-30 DIAGNOSIS — F900 Attention-deficit hyperactivity disorder, predominantly inattentive type: Secondary | ICD-10-CM | POA: Diagnosis not present

## 2021-03-30 DIAGNOSIS — F3342 Major depressive disorder, recurrent, in full remission: Secondary | ICD-10-CM

## 2021-03-30 MED ORDER — METHYLPHENIDATE HCL 20 MG PO TABS: ORAL_TABLET | ORAL | 0 refills | Status: DC

## 2021-03-30 MED ORDER — DULOXETINE HCL 30 MG PO CPEP: ORAL_CAPSULE | ORAL | 1 refills | Status: DC

## 2021-03-30 MED ORDER — DULOXETINE HCL 60 MG PO CPEP: 60.0000 mg | ORAL_CAPSULE | ORAL | 1 refills | Status: DC

## 2021-03-30 MED ORDER — PERPHENAZINE 2 MG PO TABS: 2.0000 mg | ORAL_TABLET | Freq: Every day | ORAL | 1 refills | Status: DC

## 2021-03-30 MED ORDER — AMITRIPTYLINE HCL 10 MG PO TABS: 40.0000 mg | ORAL_TABLET | Freq: Every day | ORAL | 1 refills | Status: DC

## 2021-03-30 MED ORDER — BUPROPION HCL ER (XL) 300 MG PO TB24: 300.0000 mg | ORAL_TABLET | Freq: Every morning | ORAL | 1 refills | Status: DC

## 2021-03-30 MED ORDER — ZOLPIDEM TARTRATE 10 MG PO TABS: ORAL_TABLET | ORAL | 1 refills | Status: DC

## 2021-03-30 NOTE — Progress Notes (Signed)
Jeanne [name] [id number] [birth year] 60 y.o.  Virtual Visit via Telephone Note  I connected with pt on 03/30/21 at  9:00 AM EDT by telephone and verified that I am speaking with the correct person using two identifiers.   I discussed the limitations, risks, security and privacy concerns of performing an evaluation and management service by telephone and the availability of in person appointments. I also discussed with the patient that there may be a patient responsible charge related to this service. The patient expressed understanding and agreed to proceed.   I discussed the assessment and treatment plan with the patient. The patient was provided an opportunity to ask questions and all were answered. The patient agreed with the plan and demonstrated an understanding of the instructions.   The patient was advised to call back or seek an in-person evaluation if the symptoms worsen or if the condition fails to improve as anticipated.  I provided 15 minutes of non-face-to-face time during this encounter.  The patient was located at home.  The provider was located at [place].   Thayer Headings, PMHNP   Subjective:   Patient ID:  Jeanne [name] is a 60 y.o. (DOB [birth year]) female.  Chief Complaint:  Chief Complaint  Patient presents with   Follow-up    H/o Depression, insomnia, ADD    HPI Jeanne [name] presents for follow-up of depression, anxiety, insomnia, and ADHD. 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 She reports that her mood has been improved and has not had irritability. Denies depressed mood. Denies anxiety. Motivation has been good. Sleeping "much better." Sleeping through the night and is able to return to sleep without difficulty after awakening to use the bathroom. She has been going to a weight loss program, "[employer]," and has lost 8 lbs and would like to lose additional weight. Concentration is  adequate. Denies SI.   She reports that "all 3 of my sons are steady" and doing well. Denies any acute stressors or changes.   Past medication trials: Prozac- took for approximately 30 to 40 years Zoloft Wellbutrin Amitriptyline Perphenazine Methylphenidate Adderall Abilify Lamictal-itching Cymbalta Latuda Cerefolin  Review of Systems:  Review of Systems  Cardiovascular:  Negative for palpitations.  Musculoskeletal:  Negative for gait problem.  Neurological:  Negative for tremors.       Denies involuntary movements.   Psychiatric/Behavioral:         Please refer to HPI   Medications: I have reviewed the patient's current medications.  Current Outpatient Medications  Medication Sig Dispense Refill   alendronate (FOSAMAX) 70 MG tablet Take by mouth.     amitriptyline (ELAVIL) 10 MG tablet Take 4 tablets (40 mg total) by mouth at bedtime. 360 tablet 1   atorvastatin (LIPITOR) 20 MG tablet Take 20 mg by mouth daily.      buPROPion (WELLBUTRIN XL) 300 MG 24 hr tablet Take 1 tablet (300 mg total) by mouth every morning. 90 tablet 1   Calcium Carbonate-Vitamin D (CALCIUM + D PO) Take 1 tablet by mouth 2 (two) times daily.      DULoxetine (CYMBALTA) 30 MG capsule TAKE ONE CAPSULE BY MOUTH DAILY TAKE WITH 60 MG CAPSULE TO EQUAL A TOTAL DOSE OF 90 MG 90 capsule 1   DULoxetine (CYMBALTA) 60 MG capsule Take 1 capsule (60 mg total) by mouth every morning. Take with 30 mg capsule to equal 90 mg qd 90 capsule 1  GABAPENTIN PO Take 900 mg by mouth 2 (two) times daily.     [START ON 04/20/2021] methylphenidate (RITALIN) 20 MG tablet Take 1 tablet (20 mg total) by mouth every morning AND 0.5 tablets (10 mg total) daily at 12 noon. 135 tablet 0   omeprazole (PRILOSEC) 20 MG capsule Take 20 mg by mouth daily as needed (heartburn/indigestion.).  (Patient not taking: Reported on 05/23/2020)     perphenazine (TRILAFON) 2 MG tablet Take 1 tablet (2 mg total) by mouth at bedtime. 90 tablet 1    Polyethyl Glycol-Propyl Glycol (LUBRICANT EYE DROPS) 0.4-0.3 % SOLN Place 1 drop into both eyes 3 (three) times daily as needed (dry/irritated eyes.).     polyethylene glycol powder (GLYCOLAX/MIRALAX) 17 GM/SCOOP powder Take 17 g by mouth at bedtime.      [START ON 04/24/2021] zolpidem (AMBIEN) 10 MG tablet TAKE 1 AND 1/2 TABLET BY MOUTH EVERY NIGHT AT BEDTIME 135 tablet 1   No current facility-administered medications for this visit.    Medication Side Effects: Other: Dry mouth  Allergies:  Allergies  Allergen Reactions   Lamictal [Lamotrigine] Itching    Past Medical History:  Diagnosis Date   Acid reflux    Anxiety    Back pain    Depression    Elevated cholesterol    HSV (herpes simplex virus) anogenital infection    Hx gestational diabetes    Hyperprolactinemia ([place])    valued 2 with normal MRI 2012   Osteopenia 06/2019   T score -1.1 stable from prior DEXA FRAX 11% / 0.3%   Osteoporosis     Family History  Problem Relation Age of Onset   Hypertension Mother    Depression Mother    Heart disease Father    Hypertension Father    Heart attack Father 28       heart attack, died   Alcohol abuse Father    Diabetes Maternal Grandmother    Cancer Maternal Grandmother        colon   Colon cancer Maternal Grandmother 56   Anxiety disorder Maternal Grandmother    Depression Maternal Grandmother    Diabetes Maternal Grandfather    Drug abuse Son    Drug abuse Son    Drug abuse Son     Social History   Socioeconomic History   Marital status: Married    Spouse name: Not on file   Number of children: Not on file   Years of education: Not on file   Highest education level: Not on file  Occupational History   Not on file  Tobacco Use   Smoking status: Never   Smokeless tobacco: Never  Vaping Use   Vaping Use: Never used  Substance and Sexual Activity   Alcohol use: Yes    Alcohol/week: 0.0 standard drinks    Comment: Rare   Drug use: No   Sexual activity:  Yes    Birth control/protection: Post-menopausal, Surgical    Comment: vasectomy-1st intercourse 12 yo-5 partners  Other Topics Concern   Not on file  Social History Narrative   Not on file   Social Determinants of Health   Financial Resource Strain: Not on file  Food Insecurity: Not on file  Transportation Needs: Not on file  Physical Activity: Not on file  Stress: Not on file  Social Connections: Not on file  Intimate Partner Violence: Not on file    Past Medical History, Surgical history, Social history, and Family history were reviewed and updated as  appropriate.   Please see review of systems for further details on the patient's review from today.   Objective:   Physical Exam:  BP 91/67   Pulse 90   Wt 143 lb (64.9 kg)   LMP 07/01/2002   BMI 25.74 kg/m   Physical Exam Neurological:     Mental Status: She is alert and oriented to person, place, and time.     Cranial Nerves: No dysarthria.  Psychiatric:        Attention and Perception: Attention and perception normal.        Mood and Affect: Mood normal.        Speech: Speech normal.        Behavior: Behavior is cooperative.        Thought Content: Thought content normal. Thought content is not paranoid or delusional. Thought content does not include homicidal or suicidal ideation. Thought content does not include homicidal or suicidal plan.        Cognition and Memory: Cognition and memory normal.        Judgment: Judgment normal.     Comments: Insight intact    Lab Review:     Component Value Date/Time   NA 134 (L) 07/05/2019 1436   K 4.6 07/05/2019 1436   CL 97 (L) 07/05/2019 1436   CO2 30 07/05/2019 1436   GLUCOSE 87 07/05/2019 1436   BUN 21 (H) 07/05/2019 1436   CREATININE 0.93 07/05/2019 1436   CREATININE 0.97 02/11/2017 0838   CALCIUM 9.1 07/05/2019 1436   PROT 6.6 02/11/2017 0838   ALBUMIN 4.4 02/11/2017 0838   AST 19 02/11/2017 0838   ALT 18 02/11/2017 0838   ALKPHOS 68 02/11/2017 0838    BILITOT 0.4 02/11/2017 0838   GFRNONAA >60 07/05/2019 1436   GFRAA >60 07/05/2019 1436       Component Value Date/Time   WBC 6.0 07/05/2019 1436   RBC 4.22 07/05/2019 1436   HGB 12.7 07/05/2019 1436   HCT 39.6 07/05/2019 1436   PLT 281 07/05/2019 1436   MCV 93.8 07/05/2019 1436   MCH 30.1 07/05/2019 1436   MCHC 32.1 07/05/2019 1436   RDW 13.1 07/05/2019 1436   LYMPHSABS 1.4 02/03/2015 0943   MONOABS 0.4 02/03/2015 0943   EOSABS 0.1 02/03/2015 0943   BASOSABS 0.1 02/03/2015 0943    No results found for: POCLITH, LITHIUM   No results found for: PHENYTOIN, PHENOBARB, VALPROATE, CBMZ   .res Assessment: Plan:   Will continue current plan of care since target signs and symptoms are well controlled without any tolerability issues. Continue Cymbalta 90 mg po qd for depression.  Continue Wellbutrin XL 300 mg po q am for depression.  Continue Trilafon 2 mg at bedtime for depression.  Continue amitriptyline 40 mg po QHS for mood and insomnia.  Continue Ambien 15 mg po QHS for insomnia.  Continue Ritalin 20 mg in the morning and 10 mg at noon for ADD.  Pt to follow-up in 6 months or sooner if clinically indicated.  Requested pt call in 3 months to provide update and request additional scripts.  Patient advised to contact office with any questions, adverse effects, or acute worsening in signs and symptoms.   Jeanne [name] was seen today for follow-up.  Diagnoses and all orders for this visit:  Recurrent major depressive disorder, in full remission ([place]) -     buPROPion (WELLBUTRIN XL) 300 MG 24 hr tablet; Take 1 tablet (300 mg total) by mouth every morning. -  amitriptyline (ELAVIL) 10 MG tablet; Take 4 tablets (40 mg total) by mouth at bedtime. -     DULoxetine (CYMBALTA) 30 MG capsule; TAKE ONE CAPSULE BY MOUTH DAILY TAKE WITH 60 MG CAPSULE TO EQUAL A TOTAL DOSE OF 90 MG -     DULoxetine (CYMBALTA) 60 MG capsule; Take 1 capsule (60 mg total) by mouth every morning. Take with 30 mg  capsule to equal 90 mg qd -     perphenazine (TRILAFON) 2 MG tablet; Take 1 tablet (2 mg total) by mouth at bedtime.  Insomnia, unspecified type -     amitriptyline (ELAVIL) 10 MG tablet; Take 4 tablets (40 mg total) by mouth at bedtime. -     zolpidem (AMBIEN) 10 MG tablet; TAKE 1 AND 1/2 TABLET BY MOUTH EVERY NIGHT AT BEDTIME  Attention deficit hyperactivity disorder (ADHD), predominantly inattentive type -     methylphenidate (RITALIN) 20 MG tablet; Take 1 tablet (20 mg total) by mouth every morning AND 0.5 tablets (10 mg total) daily at 12 noon.   Please see After Visit Summary for patient specific instructions.  Future Appointments  Date Time Provider [place]  06/19/2021  9:00 AM Marny Lowenstein A, NP [facility] None    No orders of the defined types were placed in this encounter.     -------------------------------

## 2021-04-03 ENCOUNTER — Telehealth: Payer: Self-pay | Admitting: Psychiatry

## 2021-04-03 NOTE — Telephone Encounter (Signed)
Pt called to advise 2 Rx are ready to pick up.

## 2021-06-19 ENCOUNTER — Encounter: Payer: Self-pay | Admitting: Nurse Practitioner

## 2021-06-19 ENCOUNTER — Other Ambulatory Visit: Payer: Self-pay

## 2021-06-19 ENCOUNTER — Ambulatory Visit (INDEPENDENT_AMBULATORY_CARE_PROVIDER_SITE_OTHER): Payer: Commercial Managed Care - PPO | Admitting: Nurse Practitioner

## 2021-06-19 VITALS — BP 118/74 | Ht 62.5 in | Wt 143.0 lb

## 2021-06-19 DIAGNOSIS — M85851 Other specified disorders of bone density and structure, right thigh: Secondary | ICD-10-CM | POA: Diagnosis not present

## 2021-06-19 DIAGNOSIS — Z01419 Encounter for gynecological examination (general) (routine) without abnormal findings: Secondary | ICD-10-CM | POA: Diagnosis not present

## 2021-06-19 DIAGNOSIS — Z780 Asymptomatic menopausal state: Secondary | ICD-10-CM | POA: Diagnosis not present

## 2021-06-19 DIAGNOSIS — B009 Herpesviral infection, unspecified: Secondary | ICD-10-CM

## 2021-06-19 MED ORDER — VALACYCLOVIR HCL 500 MG PO TABS: 500.0000 mg | ORAL_TABLET | Freq: Two times a day (BID) | ORAL | 1 refills | Status: DC

## 2021-06-19 NOTE — Progress Notes (Signed)
Chaeli Judy Butters [birth year] [id number]   History:  60 y.o. G4P0013 presents for annual exam. Postmenopausal - no HRT, no bleeding. Normal pap and mammogram history. History of hyperprolactinemia - normal MRI and levels, no longer screening. On Fosamax most recently since 11/2019 due to recurrent fractures, prescribed by PCP. She also took years ago for osteopenia/osteoporosis for a few years. Total left hip replacement in 2021. ADHD, MDD managed by psychiatry.   Gynecologic History Patient's last menstrual period was 07/01/2002.   Contraception/Family planning: post menopausal status Sexually active: Yes  Health Maintenance Last Pap: 04/23/2019. Results were: Normal, 5-year repeat Last mammogram: 11/2020. Results were: Normal per patient Last colonoscopy: 02/06/2012. Results were: Normal Last Dexa: 06/22/2019. Results were: T-score -1.1, 2-year repeat  Past medical history, past surgical history, family history and social history were all reviewed and documented in the EPIC chart. Married. 3 sons, 2 stepdaughters. 4 grandchildren. 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   ROS:  A ROS was performed and pertinent positives and negatives are included.  Exam:  Vitals:   06/19/21 0838  BP: 118/74  Weight: 143 lb (64.9 kg)  Height: 5' 2.5" (1.588 m)   Body mass index is 25.74 kg/m.  General appearance:  Normal Thyroid:  Symmetrical, normal in size, without palpable masses or nodularity. Respiratory  Auscultation:  Clear without wheezing or rhonchi Cardiovascular  Auscultation:  Regular rate, without rubs, murmurs or gallops  Edema/varicosities:  Not grossly evident Abdominal  Soft,nontender, without masses, guarding or rebound.  Liver/spleen:  No organomegaly noted  Hernia:  None appreciated  Skin  Inspection:  Grossly normal Breasts: Examined lying and sitting.   Right: Without masses, retractions, nipple discharge or axillary adenopathy.   Left: Without  masses, retractions, nipple discharge or axillary adenopathy. Genitourinary   Inguinal/mons:  Normal without inguinal adenopathy  External genitalia:  Normal appearing vulva with no masses, tenderness, or lesions  BUS/Urethra/Skene's glands:  Normal  Vagina:  Normal appearing with normal color and discharge, no lesions. Atrophic changes.   Cervix:  Normal appearing without discharge or lesions  Uterus:  Normal in size, shape and contour.  Midline and mobile, nontender  Adnexa/parametria:     Rt: Normal in size, without masses or tenderness.   Lt: Normal in size, without masses or tenderness.  Anus and perineum: Normal  Digital rectal exam: Normal sphincter tone without palpated masses or tenderness  Patient informed chaperone available to be present for breast and pelvic exam. Patient has requested no chaperone to be present. Patient has been advised what will be completed during breast and pelvic exam.   Assessment/Plan:  60 y.o. G4P0013 for annual exam.   Well female exam with routine gynecological exam - Education provided on SBEs, importance of preventative screenings, current guidelines, high calcium diet, regular exercise, and multivitamin daily.  Labs with PCP.   Postmenopausal - Plan: DG Bone Density. No HRT, no bleeding.   HSV-1 (herpes simplex virus 1) infection - Plan: valACYclovir (VALTREX) 500 MG tablet daily x 3-5 days at first sign of outbreak. Has occasional outbreaks.   Osteopenia of right hip - Plan: DG Bone Density. On Fosamax most recently since 11/2019 due to recurrent fractures, prescribed by PCP. She also took years ago for osteopenia/osteoporosis for a few years. Total left hip replacement in 2021. Most recent T-score -1.1. Will repeat DXA now. Continue Vitamin D + Calcium and regular exercise.   Screening for cervical cancer - Normal Pap history.  Will repeat at  5-year interval per guidelines.  Screening for breast cancer - Normal mammogram history.  Continue  annual screenings.  Normal breast exam today.  Screening for colon cancer - 2013 colonoscopy. Will repeat at 10-year interval per GI's recommendation.   Return in 1 year for annual.   Tamela Gammon DNP, 8:55 AM 06/19/2021

## 2021-08-21 ENCOUNTER — Other Ambulatory Visit: Payer: Self-pay

## 2021-08-21 ENCOUNTER — Ambulatory Visit (INDEPENDENT_AMBULATORY_CARE_PROVIDER_SITE_OTHER): Payer: Commercial Managed Care - PPO

## 2021-08-21 ENCOUNTER — Other Ambulatory Visit: Payer: Self-pay | Admitting: Nurse Practitioner

## 2021-08-21 DIAGNOSIS — Z780 Asymptomatic menopausal state: Secondary | ICD-10-CM

## 2021-08-21 DIAGNOSIS — M85851 Other specified disorders of bone density and structure, right thigh: Secondary | ICD-10-CM

## 2021-08-21 DIAGNOSIS — Z13820 Encounter for screening for osteoporosis: Secondary | ICD-10-CM | POA: Diagnosis not present

## 2021-10-01 ENCOUNTER — Ambulatory Visit: Payer: Commercial Managed Care - PPO | Admitting: Psychiatry

## 2021-10-01 ENCOUNTER — Encounter: Payer: Self-pay | Admitting: Psychiatry

## 2021-10-01 DIAGNOSIS — F900 Attention-deficit hyperactivity disorder, predominantly inattentive type: Secondary | ICD-10-CM

## 2021-10-01 DIAGNOSIS — G4700 Insomnia, unspecified: Secondary | ICD-10-CM | POA: Diagnosis not present

## 2021-10-01 DIAGNOSIS — F3342 Major depressive disorder, recurrent, in full remission: Secondary | ICD-10-CM | POA: Diagnosis not present

## 2021-10-01 MED ORDER — ZOLPIDEM TARTRATE 10 MG PO TABS: ORAL_TABLET | ORAL | 1 refills | Status: DC

## 2021-10-01 MED ORDER — DULOXETINE HCL 60 MG PO CPEP: 60.0000 mg | ORAL_CAPSULE | ORAL | 2 refills | Status: DC

## 2021-10-01 MED ORDER — AMITRIPTYLINE HCL 10 MG PO TABS: 40.0000 mg | ORAL_TABLET | Freq: Every day | ORAL | 2 refills | Status: DC

## 2021-10-01 MED ORDER — BUPROPION HCL ER (XL) 300 MG PO TB24: 300.0000 mg | ORAL_TABLET | Freq: Every morning | ORAL | 2 refills | Status: DC

## 2021-10-01 MED ORDER — PERPHENAZINE 2 MG PO TABS: 2.0000 mg | ORAL_TABLET | Freq: Every day | ORAL | 2 refills | Status: DC

## 2021-10-01 MED ORDER — METHYLPHENIDATE HCL 20 MG PO TABS: ORAL_TABLET | ORAL | 0 refills | Status: DC

## 2021-10-01 MED ORDER — DULOXETINE HCL 30 MG PO CPEP: ORAL_CAPSULE | ORAL | 2 refills | Status: DC

## 2021-10-01 NOTE — Progress Notes (Signed)
Jeanne [name] ?[id number] ?September 02, 1960 ?61 y.o. ? ?Subjective:  ? ?Patient ID:  Jeanne [name] is a 61 y.o. (DOB 01/31/1961) female. ? ?Chief Complaint:  ?Chief Complaint  ?Patient presents with  ? Follow-up  ?  Depression, Anxiety, insomnia  ? ? ?HPI ?Jeanne [name] presents to the office today for follow-up of depression and insomnia. She reports that her mood has been ok other than when her son was having difficulty. She has occasional anxiety when one of her sons are not doing well. "I'm ok now." She reports that she is "less OCD than I used to be." Sleeping well. Appetite has been good. She reports that she has been on a diet. Energy has been good. She reports that energy is lower on the weekends. Motivation has been good. Concentration is adequate. Denies SI.  ? ?She ran out of Duloxetine on Friday and noticed headaches yesterday.  ? 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 She would like to read and start working out with husband. She would like to travel. Work has been going ok. Her middle son recent had some difficulty recently and is now doing well. Oldest son is doing well. Youngest son is in [place].  ? ?Past medication trials: ?Prozac- took for approximately 30 to 40 years ?Zoloft ?Wellbutrin ?Amitriptyline ?Perphenazine ?Methylphenidate ?Adderall ?Abilify ?[place] ?Cymbalta ?Latuda ?Cerefolin ? ?AIMS   ? ?[place] Office Visit from 10/01/2021 in [place] Visit from 04/13/2020 in [place] Visit from 09/21/2019 in [place] Visit from 06/01/2019 in [place]  ?AIMS Total Score '1 1 1 '$ 0  ? ?  ?  ? ?Review of Systems:  ?Review of Systems  ?Musculoskeletal:  Negative for gait problem.  ?Neurological:  Negative for tremors.  ?Psychiatric/Behavioral:    ?     Please refer to HPI  ? ?Medications: I have reviewed the patient's current medications. ? ?Current Outpatient Medications   ?Medication Sig Dispense Refill  ? atorvastatin (LIPITOR) 20 MG tablet Take 20 mg by mouth daily.     ? Calcium Carbonate-Vitamin D (CALCIUM + D PO) Take 1 tablet by mouth 2 (two) times daily.     ? omeprazole (PRILOSEC) 20 MG capsule Take 20 mg by mouth daily as needed (heartburn/indigestion.).    ? Polyethyl Glycol-Propyl Glycol (LUBRICANT EYE DROPS) 0.4-0.3 % SOLN Place 1 drop into both eyes 3 (three) times daily as needed (dry/irritated eyes.).    ? polyethylene glycol powder (GLYCOLAX/MIRALAX) 17 GM/SCOOP powder Take 17 g by mouth at bedtime.     ? solifenacin (VESICARE) 5 MG tablet Take by mouth.    ? valACYclovir (VALTREX) 500 MG tablet Take 1 tablet (500 mg total) by mouth 2 (two) times daily. Take at first sign of outbreak for 3-5 days 30 tablet 1  ? amitriptyline (ELAVIL) 10 MG tablet Take 4 tablets (40 mg total) by mouth at bedtime. 360 tablet 2  ? buPROPion (WELLBUTRIN XL) 300 MG 24 hr tablet Take 1 tablet (300 mg total) by mouth every morning. 90 tablet 2  ? DULoxetine (CYMBALTA) 30 MG capsule TAKE ONE CAPSULE BY MOUTH DAILY TAKE WITH 60 MG CAPSULE TO EQUAL A TOTAL DOSE OF 90 MG 90 capsule 2  ? DULoxetine (CYMBALTA) 60 MG capsule Take 1 capsule (60 mg total) by mouth every morning. Take with 30 mg capsule to equal 90 mg qd 90 capsule 2  ? GABAPENTIN PO Take 900 mg by  mouth 2 (two) times daily.    ? methylphenidate (RITALIN) 20 MG tablet Take 1 tablet (20 mg total) by mouth every morning AND 0.5 tablets (10 mg total) daily at 12 noon. 135 tablet 0  ? perphenazine (TRILAFON) 2 MG tablet Take 1 tablet (2 mg total) by mouth at bedtime. 90 tablet 2  ? [START ON 11/04/2021] zolpidem (AMBIEN) 10 MG tablet TAKE 1 AND 1/2 TABLET BY MOUTH EVERY NIGHT AT BEDTIME 135 tablet 1  ? ?No current facility-administered medications for this visit.  ? ? ?Medication Side Effects: Other: Dry mouth ? ?Allergies:  ?Allergies  ?Allergen Reactions  ? Lamictal [Lamotrigine] Itching  ? ? ?Past Medical History:  ?Diagnosis Date  ?  Acid reflux   ? Anxiety   ? Back pain   ? Depression   ? Elevated cholesterol   ? HSV (herpes simplex virus) anogenital infection   ? Hx gestational diabetes   ? Hyperprolactinemia ([place])   ? valued 72 with normal MRI 2012  ? Osteopenia 06/2019  ? T score -1.1 stable from prior DEXA FRAX 11% / 0.3%  ? Osteoporosis   ? Overactive bladder   ? ? ?Past Medical History, Surgical history, Social history, and Family history were reviewed and updated as appropriate.  ? ?Please see review of systems for further details on the patient's review from today.  ? ?Objective:  ? ?Physical Exam:  ?BP (!) 141/94   Pulse 85   Wt 138 lb (62.6 kg)   LMP 07/01/2002   BMI 24.84 kg/m?  ? ?Physical Exam ?Constitutional:   ?   General: She is not in acute distress. ?Musculoskeletal:     ?   General: No deformity.  ?Neurological:  ?   Mental Status: She is alert and oriented to person, place, and time.  ?   Coordination: Coordination normal.  ?Psychiatric:     ?   Attention and Perception: Attention and perception normal. She does not perceive auditory or visual hallucinations.     ?   Mood and Affect: Mood normal. Mood is not anxious or depressed. Affect is not labile, blunt, angry or inappropriate.     ?   Speech: Speech normal.     ?   Behavior: Behavior normal.     ?   Thought Content: Thought content normal. Thought content is not paranoid or delusional. Thought content does not include homicidal or suicidal ideation. Thought content does not include homicidal or suicidal plan.     ?   Cognition and Memory: Cognition and memory normal.     ?   Judgment: Judgment normal.  ?   Comments: Insight intact  ? ? ?Lab Review:  ?   ?Component Value Date/Time  ? NA 134 (L) 07/05/2019 1436  ? K 4.6 07/05/2019 1436  ? CL 97 (L) 07/05/2019 1436  ? CO2 30 07/05/2019 1436  ? GLUCOSE 87 07/05/2019 1436  ? BUN 21 (H) 07/05/2019 1436  ? CREATININE 0.93 07/05/2019 1436  ? CREATININE 0.97 02/11/2017 0838  ? CALCIUM 9.1 07/05/2019 1436  ? PROT 6.6  02/11/2017 0838  ? ALBUMIN 4.4 02/11/2017 0838  ? AST 19 02/11/2017 0838  ? ALT 18 02/11/2017 0838  ? ALKPHOS 68 02/11/2017 0838  ? BILITOT 0.4 02/11/2017 0838  ? GFRNONAA >60 07/05/2019 1436  ? GFRAA >60 07/05/2019 1436  ? ? ?   ?Component Value Date/Time  ? WBC 6.0 07/05/2019 1436  ? RBC 4.22 07/05/2019 1436  ? HGB 12.7 07/05/2019 1436  ?  HCT 39.6 07/05/2019 1436  ? PLT 281 07/05/2019 1436  ? MCV 93.8 07/05/2019 1436  ? MCH 30.1 07/05/2019 1436  ? MCHC 32.1 07/05/2019 1436  ? RDW 13.1 07/05/2019 1436  ? LYMPHSABS 1.4 02/03/2015 0943  ? MONOABS 0.4 02/03/2015 0943  ? EOSABS 0.1 02/03/2015 0943  ? BASOSABS 0.1 02/03/2015 0943  ? ? ?No results found for: POCLITH, LITHIUM  ? ?No results found for: PHENYTOIN, PHENOBARB, VALPROATE, CBMZ  ? ?.res ?Assessment: Plan:   ?Will continue current plan of care since target signs and symptoms are well controlled without any tolerability issues. ?Continue Cymbalta 90 mg po qd for depression.  ?Continue Wellbutrin XL 300 mg po q am for depression.  ?Continue Trilafon 2 mg at bedtime for depression.  ?Continue amitriptyline 40 mg po QHS for mood and insomnia.  ?Continue Ambien 15 mg po QHS for insomnia.  ?Continue Ritalin 20 mg in the morning and 10 mg at noon for ADD.  ?Pt to follow-up in 6 months or sooner if clinically indicated.  ?Requested pt call when current script of Ritalin is about to end to provide update and request additional scripts.  ?Patient advised to contact office with any questions, adverse effects, or acute worsening in signs and symptoms. ? ? ?Jeanne [name] was seen today for follow-up. ? ?Diagnoses and all orders for this visit: ? ?Recurrent major depressive disorder, in full remission ([place]) ?-     amitriptyline (ELAVIL) 10 MG tablet; Take 4 tablets (40 mg total) by mouth at bedtime. ?-     buPROPion (WELLBUTRIN XL) 300 MG 24 hr tablet; Take 1 tablet (300 mg total) by mouth every morning. ?-     DULoxetine (CYMBALTA) 30 MG capsule; TAKE ONE CAPSULE BY MOUTH DAILY TAKE  WITH 60 MG CAPSULE TO EQUAL A TOTAL DOSE OF 90 MG ?-     DULoxetine (CYMBALTA) 60 MG capsule; Take 1 capsule (60 mg total) by mouth every morning. Take with 30 mg capsule to equal 90 mg qd ?-     perphenazine (TRIL

## 2021-10-15 ENCOUNTER — Other Ambulatory Visit: Payer: Self-pay | Admitting: Psychiatry

## 2021-10-15 DIAGNOSIS — G4700 Insomnia, unspecified: Secondary | ICD-10-CM

## 2021-10-15 DIAGNOSIS — F3342 Major depressive disorder, recurrent, in full remission: Secondary | ICD-10-CM

## 2021-11-14 ENCOUNTER — Other Ambulatory Visit: Payer: Self-pay | Admitting: Psychiatry

## 2021-11-14 DIAGNOSIS — F3342 Major depressive disorder, recurrent, in full remission: Secondary | ICD-10-CM

## 2021-12-15 ENCOUNTER — Other Ambulatory Visit: Payer: Self-pay | Admitting: Psychiatry

## 2021-12-15 DIAGNOSIS — F3342 Major depressive disorder, recurrent, in full remission: Secondary | ICD-10-CM

## 2022-01-15 ENCOUNTER — Other Ambulatory Visit: Payer: Self-pay | Admitting: Psychiatry

## 2022-01-15 DIAGNOSIS — F3342 Major depressive disorder, recurrent, in full remission: Secondary | ICD-10-CM

## 2022-02-12 ENCOUNTER — Other Ambulatory Visit: Payer: Self-pay | Admitting: Psychiatry

## 2022-02-12 DIAGNOSIS — F3342 Major depressive disorder, recurrent, in full remission: Secondary | ICD-10-CM

## 2022-03-13 ENCOUNTER — Ambulatory Visit: Payer: Commercial Managed Care - PPO | Admitting: Psychiatry

## 2022-03-13 ENCOUNTER — Encounter: Payer: Self-pay | Admitting: Psychiatry

## 2022-03-13 DIAGNOSIS — G4700 Insomnia, unspecified: Secondary | ICD-10-CM

## 2022-03-13 DIAGNOSIS — F900 Attention-deficit hyperactivity disorder, predominantly inattentive type: Secondary | ICD-10-CM | POA: Diagnosis not present

## 2022-03-13 DIAGNOSIS — F3342 Major depressive disorder, recurrent, in full remission: Secondary | ICD-10-CM

## 2022-03-13 MED ORDER — BUPROPION HCL ER (XL) 300 MG PO TB24: 300.0000 mg | ORAL_TABLET | Freq: Every morning | ORAL | 2 refills | Status: DC

## 2022-03-13 MED ORDER — DULOXETINE HCL 60 MG PO CPEP: 60.0000 mg | ORAL_CAPSULE | ORAL | 2 refills | Status: DC

## 2022-03-13 MED ORDER — AMITRIPTYLINE HCL 10 MG PO TABS: 40.0000 mg | ORAL_TABLET | Freq: Every day | ORAL | 2 refills | Status: DC

## 2022-03-13 MED ORDER — METHYLPHENIDATE HCL 20 MG PO TABS: ORAL_TABLET | ORAL | 0 refills | Status: DC

## 2022-03-13 MED ORDER — PERPHENAZINE 2 MG PO TABS: 2.0000 mg | ORAL_TABLET | Freq: Every day | ORAL | 2 refills | Status: DC

## 2022-03-13 MED ORDER — ZOLPIDEM TARTRATE 10 MG PO TABS
ORAL_TABLET | ORAL | 1 refills | Status: DC
Start: 2022-05-17 — End: 2022-09-11

## 2022-03-13 MED ORDER — DULOXETINE HCL 30 MG PO CPEP: ORAL_CAPSULE | ORAL | 2 refills | Status: DC

## 2022-03-13 NOTE — Progress Notes (Signed)
Jeanne [name] [id number] [id number] 61 y.o.  Subjective:   Patient ID:  Jeanne [name] is a 61 y.o. (DOB 08-Nov-1960) female.  Chief Complaint:  Chief Complaint  Patient presents with   Follow-up    Depression, anxiety, insomnia, and ADHD    HPI [name] presents to the office today for follow-up of depression, anxiety, ADHD, and insomnia. She reports that she is worried about her middle son who has relapsed. Denis any other anxiety. She reports that her other 2 sons are doing well. She reports that her mood is usually lower when her sons are having difficulties. Sleeping ok. "Sleep is one of my favorite times." Appetite has been ok. Energy and motivation is good. Concentration has been good. Denies SI.   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  Ambien last filled 02/22/22. Methylphenidate 8/25  Past medication trials: Prozac- took for approximately 30 to 40 years Zoloft Wellbutrin Amitriptyline Perphenazine Methylphenidate Adderall Abilify Lamictal-itching Cymbalta [place] Office Visit from 10/01/2021 in [place] Visit from 04/13/2020 in [place] Visit from 09/21/2019 in [place] Visit from 06/01/2019 in [place] Total Score '1 1 1 '$ 0        Review of Systems:  Review of Systems  Gastrointestinal: Negative.   Musculoskeletal:  Negative for gait problem.  Neurological:  Negative for tremors.  Psychiatric/Behavioral:         Please refer to HPI    Medications: I have reviewed the patient's current medications.  Current Outpatient Medications  Medication Sig Dispense Refill   omeprazole (PRILOSEC) 20 MG capsule Take 20 mg by mouth daily as needed (heartburn/indigestion.).     Polyethyl Glycol-Propyl Glycol (LUBRICANT EYE DROPS) 0.4-0.3 % SOLN Place 1 drop into both eyes 3 (three) times daily as needed (dry/irritated eyes.).      polyethylene glycol powder (GLYCOLAX/MIRALAX) 17 GM/SCOOP powder Take 17 g by mouth at bedtime.      solifenacin (VESICARE) 5 MG tablet Take by mouth.     valACYclovir (VALTREX) 500 MG tablet Take 1 tablet (500 mg total) by mouth 2 (two) times daily. Take at first sign of outbreak for 3-5 days 30 tablet 1   amitriptyline (ELAVIL) 10 MG tablet Take 4 tablets (40 mg total) by mouth at bedtime. 360 tablet 2   atorvastatin (LIPITOR) 10 MG tablet Take 10 mg by mouth daily.     buPROPion (WELLBUTRIN XL) 300 MG 24 hr tablet Take 1 tablet (300 mg total) by mouth every morning. 90 tablet 2   Calcium Carbonate-Vitamin D (CALCIUM + D PO) Take 1 tablet by mouth 2 (two) times daily.      DULoxetine (CYMBALTA) 30 MG capsule TAKE ONE CAPSULE BY MOUTH DAILY TAKE WITH 60 MG CAPSULE TO EQUAL A TOTAL DOSE OF 90 MG 90 capsule 2   DULoxetine (CYMBALTA) 60 MG capsule Take 1 capsule (60 mg total) by mouth every morning. Take with 30 mg capsule to equal 90 mg qd 90 capsule 2   GABAPENTIN PO Take 900 mg by mouth 2 (two) times daily.     methylphenidate (RITALIN) 20 MG tablet Take 1 tablet (20 mg total) by mouth every morning AND 0.5 tablets (10 mg total) daily at 12 noon. 135 tablet 0   perphenazine (TRILAFON) 2 MG tablet Take 1 tablet (2 mg total) by mouth at bedtime. 90 tablet 2   [START ON 05/17/2022] zolpidem (AMBIEN)  10 MG tablet TAKE 1 AND 1/2 TABLET BY MOUTH EVERY NIGHT AT BEDTIME 135 tablet 1   No current facility-administered medications for this visit.    Medication Side Effects: Other: dry mouth  Allergies:  Allergies  Allergen Reactions   Lamictal [Lamotrigine] Itching    Past Medical History:  Diagnosis Date   Acid reflux    Anxiety    Back pain    Depression    Elevated cholesterol    HSV (herpes simplex virus) anogenital infection    Hx gestational diabetes    Hyperprolactinemia ([place])    valued 46 with normal MRI 2012   Osteopenia 06/2019   T score -1.1 stable from prior DEXA FRAX 11% /  0.3%   Osteoporosis    Overactive bladder     Past Medical History, Surgical history, Social history, and Family history were reviewed and updated as appropriate.   Please see review of systems for further details on the patient's review from today.   Objective:   Physical Exam:  BP (!) 149/98   Pulse 84   LMP 07/01/2002   Physical Exam Constitutional:      General: She is not in acute distress. Musculoskeletal:        General: No deformity.  Neurological:     Mental Status: She is alert and oriented to person, place, and time.     Coordination: Coordination normal.  Psychiatric:        Attention and Perception: Attention and perception normal. She does not perceive auditory or visual hallucinations.        Mood and Affect: Mood normal. Mood is not anxious or depressed. Affect is not labile, blunt, angry or inappropriate.        Speech: Speech normal.        Behavior: Behavior normal.        Thought Content: Thought content normal. Thought content is not paranoid or delusional. Thought content does not include homicidal or suicidal ideation. Thought content does not include homicidal or suicidal plan.        Cognition and Memory: Cognition and memory normal.        Judgment: Judgment normal.     Comments: Insight intact     Lab Review:     Component Value Date/Time   NA 134 (L) 07/05/2019 1436   K 4.6 07/05/2019 1436   CL 97 (L) 07/05/2019 1436   CO2 30 07/05/2019 1436   GLUCOSE 87 07/05/2019 1436   BUN 21 (H) 07/05/2019 1436   CREATININE 0.93 07/05/2019 1436   CREATININE 0.97 02/11/2017 0838   CALCIUM 9.1 07/05/2019 1436   PROT 6.6 02/11/2017 0838   ALBUMIN 4.4 02/11/2017 0838   AST 19 02/11/2017 0838   ALT 18 02/11/2017 0838   ALKPHOS 68 02/11/2017 0838   BILITOT 0.4 02/11/2017 0838   GFRNONAA >60 07/05/2019 1436   GFRAA >60 07/05/2019 1436       Component Value Date/Time   WBC 6.0 07/05/2019 1436   RBC 4.22 07/05/2019 1436   HGB 12.7 07/05/2019 1436    HCT 39.6 07/05/2019 1436   PLT 281 07/05/2019 1436   MCV 93.8 07/05/2019 1436   MCH 30.1 07/05/2019 1436   MCHC 32.1 07/05/2019 1436   RDW 13.1 07/05/2019 1436   LYMPHSABS 1.4 02/03/2015 0943   MONOABS 0.4 02/03/2015 0943   EOSABS 0.1 02/03/2015 0943   BASOSABS 0.1 02/03/2015 0943    No results found for: "POCLITH", "LITHIUM"   No results found for: "  PHENYTOIN", "PHENOBARB", "VALPROATE", "CBMZ"   .res Assessment: Plan:   Will continue current plan of care since target signs and symptoms are well controlled without any tolerability issues. Pt to follow-up in 6 months or sooner if clinically indicated.  Requested pt call in 3 months to provide update and request additional scripts.  Patient advised to contact office with any questions, adverse effects, or acute worsening in signs and symptoms.  Jaelene was seen today for follow-up.  Diagnoses and all orders for this visit:  Attention deficit hyperactivity disorder (ADHD), predominantly inattentive type -     methylphenidate (RITALIN) 20 MG tablet; Take 1 tablet (20 mg total) by mouth every morning AND 0.5 tablets (10 mg total) daily at 12 noon.  Insomnia, unspecified type -     zolpidem (AMBIEN) 10 MG tablet; TAKE 1 AND 1/2 TABLET BY MOUTH EVERY NIGHT AT BEDTIME -     amitriptyline (ELAVIL) 10 MG tablet; Take 4 tablets (40 mg total) by mouth at bedtime.  Recurrent major depressive disorder, in full remission ([place]) -     amitriptyline (ELAVIL) 10 MG tablet; Take 4 tablets (40 mg total) by mouth at bedtime. -     buPROPion (WELLBUTRIN XL) 300 MG 24 hr tablet; Take 1 tablet (300 mg total) by mouth every morning. -     DULoxetine (CYMBALTA) 30 MG capsule; TAKE ONE CAPSULE BY MOUTH DAILY TAKE WITH 60 MG CAPSULE TO EQUAL A TOTAL DOSE OF 90 MG -     DULoxetine (CYMBALTA) 60 MG capsule; Take 1 capsule (60 mg total) by mouth every morning. Take with 30 mg capsule to equal 90 mg qd -     perphenazine (TRILAFON) 2 MG tablet; Take 1 tablet (2  mg total) by mouth at bedtime.     Please see After Visit Summary for patient specific instructions.  No future appointments.  No orders of the defined types were placed in this encounter.   -------------------------------

## 2022-07-03 ENCOUNTER — Other Ambulatory Visit (HOSPITAL_COMMUNITY)
Admission: RE | Admit: 2022-07-03 | Discharge: 2022-07-03 | Disposition: A | Discharge status: Home / Self Care | Payer: Commercial Managed Care - PPO | Source: Ambulatory Visit | Attending: Nurse Practitioner | Admitting: Nurse Practitioner

## 2022-07-03 ENCOUNTER — Ambulatory Visit (INDEPENDENT_AMBULATORY_CARE_PROVIDER_SITE_OTHER): Payer: Commercial Managed Care - PPO | Admitting: Nurse Practitioner

## 2022-07-03 ENCOUNTER — Encounter: Payer: Self-pay | Admitting: Nurse Practitioner

## 2022-07-03 VITALS — BP 122/82 | HR 66 | Ht 62.0 in | Wt 136.0 lb

## 2022-07-03 DIAGNOSIS — B009 Herpesviral infection, unspecified: Secondary | ICD-10-CM | POA: Diagnosis not present

## 2022-07-03 DIAGNOSIS — Z780 Asymptomatic menopausal state: Secondary | ICD-10-CM | POA: Diagnosis not present

## 2022-07-03 DIAGNOSIS — Z01419 Encounter for gynecological examination (general) (routine) without abnormal findings: Secondary | ICD-10-CM

## 2022-07-03 DIAGNOSIS — Z8739 Personal history of other diseases of the musculoskeletal system and connective tissue: Secondary | ICD-10-CM | POA: Diagnosis not present

## 2022-07-03 MED ORDER — VALACYCLOVIR HCL 500 MG PO TABS: 500.0000 mg | ORAL_TABLET | Freq: Two times a day (BID) | ORAL | 2 refills | Status: DC

## 2022-07-03 NOTE — Progress Notes (Signed)
Jeanne [name] [birth year] [id number]   History:  62 y.o. G4P0013 presents for annual exam. Postmenopausal - no HRT, no bleeding. Normal pap and mammogram history. History of hyperprolactinemia - normal MRI and levels, no longer screening. Bone health managed by PCP, was on Fosamax in the past. Normal bone density on most recent DXA 08/2021. ADHD, MDD managed by psychiatry.   Gynecologic History Patient's last menstrual period was 07/01/2002.   Contraception/Family planning: post menopausal status Sexually active: Yes  Health Maintenance Last Pap: 04/23/2019. Results were: Normal, 5-year repeat Last mammogram: 2023. Results were: Normal per patient Last colonoscopy: 01/2022. Results were: Normal, 10-year recall Last Dexa: 08/21/2021. Results were: Normal  Past medical history, past surgical history, family history and social history were all reviewed and documented in the EPIC chart. Married. 3 sons, 2 stepdaughters. 4 grandchildren. 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   ROS:  A ROS was performed and pertinent positives and negatives are included.  Exam:  Vitals:   07/03/22 0902  BP: 122/82  Pulse: 66  SpO2: 100%  Weight: 136 lb (61.7 kg)  Height: '5\' 2"'$  (1.575 m)    Body mass index is 24.87 kg/m.  General appearance:  Normal Thyroid:  Symmetrical, normal in size, without palpable masses or nodularity. Respiratory  Auscultation:  Clear without wheezing or rhonchi Cardiovascular  Auscultation:  Regular rate, without rubs, murmurs or gallops  Edema/varicosities:  Not grossly evident Abdominal  Soft,nontender, without masses, guarding or rebound.  Liver/spleen:  No organomegaly noted  Hernia:  None appreciated  Skin  Inspection:  Grossly normal Breasts: Examined lying and sitting.   Right: Without masses, retractions, nipple discharge or axillary adenopathy.   Left: Without masses, retractions, nipple discharge or axillary  adenopathy. Genitourinary   Inguinal/mons:  Normal without inguinal adenopathy  External genitalia:  Normal appearing vulva with no masses, tenderness, or lesions  BUS/Urethra/Skene's glands:  Normal  Vagina:  Normal appearing with normal color and discharge, no lesions. Atrophic changes.   Cervix:  Normal appearing without discharge or lesions  Uterus:  Normal in size, shape and contour.  Midline and mobile, nontender  Adnexa/parametria:     Rt: Normal in size, without masses or tenderness.   Lt: Normal in size, without masses or tenderness.  Anus and perineum: Normal  Digital rectal exam: Normal sphincter tone without palpated masses or tenderness  Patient informed chaperone available to be present for breast and pelvic exam. Patient has requested no chaperone to be present. Patient has been advised what will be completed during breast and pelvic exam.   Assessment/Plan:  62 y.o. [id number] for annual exam.   Well female exam with routine gynecological exam - Plan: Cytology - PAP( Early). Education provided on SBEs, importance of preventative screenings, current guidelines, high calcium diet, regular exercise, and multivitamin daily.  Labs with PCP.   History of osteopenia - Managed by PCP. Was on Fosamax in the past. Normal bone density in February 2023. Continue Vitamin D + Calcium and regular exercise.   Postmenopausal - No HRT, no bleeding.   HSV-1 (herpes simplex virus 1) infection - Plan: valACYclovir (VALTREX) 500 MG tablet daily x 3-5 days at first sign of outbreak. Has occasional outbreaks.   Screening for cervical cancer - Normal Pap history. Discussed current guidelines and recommendations to repeat at 5-year interval. Requesting pap today.   Screening for breast cancer - Normal mammogram history.  Continue annual screenings.  Normal breast exam today.  Screening for  colon cancer - 01/2022 colonoscopy. Will repeat at 10-year interval per GI's recommendation.   Return in  1 year for annual.     Tamela Gammon DNP, 9:18 AM 07/03/2022

## 2022-07-04 LAB — CYTOLOGY - PAP: Diagnosis: NEGATIVE

## 2022-09-11 ENCOUNTER — Ambulatory Visit: Payer: Commercial Managed Care - PPO | Admitting: Psychiatry

## 2022-09-11 ENCOUNTER — Encounter: Payer: Self-pay | Admitting: Psychiatry

## 2022-09-11 DIAGNOSIS — F3342 Major depressive disorder, recurrent, in full remission: Secondary | ICD-10-CM

## 2022-09-11 DIAGNOSIS — F900 Attention-deficit hyperactivity disorder, predominantly inattentive type: Secondary | ICD-10-CM | POA: Diagnosis not present

## 2022-09-11 DIAGNOSIS — G4700 Insomnia, unspecified: Secondary | ICD-10-CM | POA: Diagnosis not present

## 2022-09-11 MED ORDER — METHYLPHENIDATE HCL 20 MG PO TABS: ORAL_TABLET | ORAL | 0 refills | Status: DC

## 2022-09-11 MED ORDER — DULOXETINE HCL 30 MG PO CPEP: ORAL_CAPSULE | ORAL | 2 refills | Status: DC

## 2022-09-11 MED ORDER — DULOXETINE HCL 60 MG PO CPEP: 60.0000 mg | ORAL_CAPSULE | ORAL | 2 refills | Status: DC

## 2022-09-11 MED ORDER — AMITRIPTYLINE HCL 10 MG PO TABS: 40.0000 mg | ORAL_TABLET | Freq: Every day | ORAL | 2 refills | Status: DC

## 2022-09-11 MED ORDER — BUPROPION HCL ER (XL) 300 MG PO TB24: 300.0000 mg | ORAL_TABLET | Freq: Every morning | ORAL | 2 refills | Status: DC

## 2022-09-11 MED ORDER — ZOLPIDEM TARTRATE 10 MG PO TABS: ORAL_TABLET | ORAL | 1 refills | Status: DC

## 2022-09-11 MED ORDER — PERPHENAZINE 2 MG PO TABS: 2.0000 mg | ORAL_TABLET | Freq: Every day | ORAL | 2 refills | Status: DC

## 2022-09-11 NOTE — Progress Notes (Signed)
Marjie Marchioni Birchard [id number] 62 y.o.  Subjective:   Patient ID:  Jeanne [name] is a 62 y.o. (DOB 22-Jun-1961) female.  Chief Complaint:  Chief Complaint  Patient presents with   Follow-up    Depression, anxiety, ADHD, and insomnia    HPI Eydie Slansky [place] presents to the office today for follow-up of depression, anxiety, ADHD, and insomnia.   She reports that she has been doing well overall. Denies any significant depression. She reports anxiety has been "fine." Energy and motivation have been good. Sleeping well with the exception of occasional urinary frequency. Appetite has been good. She reports concentration is consistent with baseline. Denies SI.   Oldest and youngest son "are doing well." Reports middle son is now living with girlfriend.   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   Ambien last filled 08/28/22. Methylphenidate last filled 05/14/22.   Past medication trials: Prozac- took for approximately 30 to 40 years Zoloft Wellbutrin Amitriptyline Perphenazine Methylphenidate Adderall Abilify Lamictal-itching Cymbalta [place] Office Visit from 10/01/2021 in [facility] Office Visit from 04/13/2020 in [facility] Office Visit from 09/21/2019 in [place] Office Visit from 06/01/2019 in [place] Total Score '1 1 1 '$ 0        Review of Systems:  Review of Systems  Genitourinary:  Positive for frequency.  Musculoskeletal:  Negative for gait problem.  Psychiatric/Behavioral:         Please refer to HPI    Medications: I have reviewed the patient's current medications.  Current Outpatient Medications  Medication Sig Dispense Refill   atorvastatin (LIPITOR) 10 MG tablet Take 10 mg by mouth daily.     Calcium Carbonate-Vitamin D (CALCIUM + D PO) Take 1 tablet by mouth 2 (two) times daily.       [START ON 12/04/2022] methylphenidate (RITALIN) 20 MG tablet Take 1 tablet (20 mg total) by mouth every morning AND 0.5 tablets (10 mg total) daily in the afternoon. 135 tablet 0   omeprazole (PRILOSEC) 20 MG capsule Take 20 mg by mouth daily as needed (heartburn/indigestion.).     Polyethyl Glycol-Propyl Glycol (LUBRICANT EYE DROPS) 0.4-0.3 % SOLN Place 1 drop into both eyes 3 (three) times daily as needed (dry/irritated eyes.).     polyethylene glycol powder (GLYCOLAX/MIRALAX) 17 GM/SCOOP powder Take 17 g by mouth at bedtime.      solifenacin (VESICARE) 5 MG tablet Take by mouth.     valACYclovir (VALTREX) 500 MG tablet Take 1 tablet (500 mg total) by mouth 2 (two) times daily. Take at first sign of outbreak for 3-5 days 30 tablet 2   amitriptyline (ELAVIL) 10 MG tablet Take 4 tablets (40 mg total) by mouth at bedtime. 360 tablet 2   buPROPion (WELLBUTRIN XL) 300 MG 24 hr tablet Take 1 tablet (300 mg total) by mouth every morning. 90 tablet 2   DULoxetine (CYMBALTA) 30 MG capsule TAKE ONE CAPSULE BY MOUTH DAILY TAKE WITH 60 MG CAPSULE TO EQUAL A TOTAL DOSE OF 90 MG 90 capsule 2   DULoxetine (CYMBALTA) 60 MG capsule Take 1 capsule (60 mg total) by mouth every morning. Take with 30 mg capsule to equal 90 mg qd 90 capsule 2   GABAPENTIN PO Take 900 mg by mouth 2 (two) times daily.     methylphenidate (RITALIN) 20 MG tablet Take 1 tablet (20 mg total) by  mouth every morning AND 0.5 tablets (10 mg total) daily at 12 noon. 135 tablet 0   perphenazine (TRILAFON) 2 MG tablet Take 1 tablet (2 mg total) by mouth at bedtime. 90 tablet 2   [START ON 11/20/2022] zolpidem (AMBIEN) 10 MG tablet TAKE 1 AND 1/2 TABLET BY MOUTH EVERY NIGHT AT BEDTIME 135 tablet 1   No current facility-administered medications for this visit.    Medication Side Effects: None  Allergies:  Allergies  Allergen Reactions   Lamictal [Lamotrigine] Itching    Past Medical History:  Diagnosis Date   Acid reflux    Anxiety     Back pain    Depression    Elevated cholesterol    HSV (herpes simplex virus) anogenital infection    Hx gestational diabetes    Hyperprolactinemia ([place])    valued 44 with normal MRI 2012   Osteopenia 06/2019   T score -1.1 stable from prior DEXA FRAX 11% / 0.3%   Osteoporosis    Overactive bladder     Past Medical History, Surgical history, Social history, and Family history were reviewed and updated as appropriate.   Please see review of systems for further details on the patient's review from today.   Objective:   Physical Exam:  BP 128/83   Pulse 83   LMP 07/01/2002   Physical Exam Constitutional:      General: She is not in acute distress. Musculoskeletal:        General: No deformity.  Neurological:     Mental Status: She is alert and oriented to person, place, and time.     Coordination: Coordination normal.  Psychiatric:        Attention and Perception: Attention and perception normal. She does not perceive auditory or visual hallucinations.        Mood and Affect: Mood normal. Mood is not anxious or depressed. Affect is not labile, blunt, angry or inappropriate.        Speech: Speech normal.        Behavior: Behavior normal.        Thought Content: Thought content normal. Thought content is not paranoid or delusional. Thought content does not include homicidal or suicidal ideation. Thought content does not include homicidal or suicidal plan.        Cognition and Memory: Cognition and memory normal.        Judgment: Judgment normal.     Comments: Insight intact     Lab Review:     Component Value Date/Time   NA 134 (L) 07/05/2019 1436   K 4.6 07/05/2019 1436   CL 97 (L) 07/05/2019 1436   CO2 30 07/05/2019 1436   GLUCOSE 87 07/05/2019 1436   BUN 21 (H) 07/05/2019 1436   CREATININE 0.93 07/05/2019 1436   CREATININE 0.97 02/11/2017 0838   CALCIUM 9.1 07/05/2019 1436   PROT 6.6 02/11/2017 0838   ALBUMIN 4.4 02/11/2017 0838   AST 19 02/11/2017 0838   ALT  18 02/11/2017 0838   ALKPHOS 68 02/11/2017 0838   BILITOT 0.4 02/11/2017 0838   GFRNONAA >60 07/05/2019 1436   GFRAA >60 07/05/2019 1436       Component Value Date/Time   WBC 6.0 07/05/2019 1436   RBC 4.22 07/05/2019 1436   HGB 12.7 07/05/2019 1436   HCT 39.6 07/05/2019 1436   PLT 281 07/05/2019 1436   MCV 93.8 07/05/2019 1436   MCH 30.1 07/05/2019 1436   MCHC 32.1 07/05/2019 1436   RDW 13.1 07/05/2019  1436   LYMPHSABS 1.4 02/03/2015 0943   MONOABS 0.4 02/03/2015 0943   EOSABS 0.1 02/03/2015 0943   BASOSABS 0.1 02/03/2015 0943    No results found for: "POCLITH", "LITHIUM"   No results found for: "PHENYTOIN", "PHENOBARB", "VALPROATE", "CBMZ"   .res Assessment: Plan:    Will continue current plan of care since target signs and symptoms are well controlled without any tolerability issues. Pt to follow-up in 6 months or sooner if clinically indicated.   Patient advised to contact office with any questions, adverse effects, or acute worsening in signs and symptoms.   Joa was seen today for follow-up.  Diagnoses and all orders for this visit:  Insomnia, unspecified type -     zolpidem (AMBIEN) 10 MG tablet; TAKE 1 AND 1/2 TABLET BY MOUTH EVERY NIGHT AT BEDTIME -     amitriptyline (ELAVIL) 10 MG tablet; Take 4 tablets (40 mg total) by mouth at bedtime.  Recurrent major depressive disorder, in full remission ([place]) -     perphenazine (TRILAFON) 2 MG tablet; Take 1 tablet (2 mg total) by mouth at bedtime. -     DULoxetine (CYMBALTA) 60 MG capsule; Take 1 capsule (60 mg total) by mouth every morning. Take with 30 mg capsule to equal 90 mg qd -     buPROPion (WELLBUTRIN XL) 300 MG 24 hr tablet; Take 1 tablet (300 mg total) by mouth every morning. -     amitriptyline (ELAVIL) 10 MG tablet; Take 4 tablets (40 mg total) by mouth at bedtime. -     DULoxetine (CYMBALTA) 30 MG capsule; TAKE ONE CAPSULE BY MOUTH DAILY TAKE WITH 60 MG CAPSULE TO EQUAL A TOTAL DOSE OF 90 MG  Attention  deficit hyperactivity disorder (ADHD), predominantly inattentive type -     methylphenidate (RITALIN) 20 MG tablet; Take 1 tablet (20 mg total) by mouth every morning AND 0.5 tablets (10 mg total) daily at 12 noon. -     methylphenidate (RITALIN) 20 MG tablet; Take 1 tablet (20 mg total) by mouth every morning AND 0.5 tablets (10 mg total) daily in the afternoon.     Please see After Visit Summary for patient specific instructions.  Future Appointments  Date Time Provider [place]  03/12/2023  9:00 AM Thayer Headings, PMHNP CP-CP None    No orders of the defined types were placed in this encounter.   -------------------------------

## 2022-12-04 ENCOUNTER — Telehealth: Payer: Self-pay | Admitting: Psychiatry

## 2022-12-04 DIAGNOSIS — F900 Attention-deficit hyperactivity disorder, predominantly inattentive type: Secondary | ICD-10-CM

## 2022-12-04 MED ORDER — METHYLPHENIDATE HCL 20 MG PO TABS: ORAL_TABLET | ORAL | 0 refills | Status: DC

## 2022-12-04 NOTE — Telephone Encounter (Signed)
[name] called at 9:15 to request an escript of [name] Ritalin.  They have the printed script but they can't accept a printed prescription.  They will cancel the printed script.  Please send in an escript.  Appt 9/11.  Send to [facility] [id number] - [place], [place] - [address]

## 2022-12-04 NOTE — Telephone Encounter (Signed)
Script has been sent electronically with fill date of 12/11/22, which is 12 weeks from last fill date of 09/18/22. Please let patient know.

## 2023-03-12 ENCOUNTER — Encounter: Payer: Self-pay | Admitting: Psychiatry

## 2023-03-12 ENCOUNTER — Ambulatory Visit: Payer: Commercial Managed Care - PPO | Admitting: Psychiatry

## 2023-03-12 DIAGNOSIS — F900 Attention-deficit hyperactivity disorder, predominantly inattentive type: Secondary | ICD-10-CM | POA: Diagnosis not present

## 2023-03-12 DIAGNOSIS — F3342 Major depressive disorder, recurrent, in full remission: Secondary | ICD-10-CM | POA: Diagnosis not present

## 2023-03-12 DIAGNOSIS — G4700 Insomnia, unspecified: Secondary | ICD-10-CM

## 2023-03-12 MED ORDER — METHYLPHENIDATE HCL 20 MG PO TABS
ORAL_TABLET | ORAL | 0 refills | Status: AC
Start: 2023-06-04 — End: 2023-09-02

## 2023-03-12 MED ORDER — DULOXETINE HCL 30 MG PO CPEP
ORAL_CAPSULE | ORAL | 2 refills | Status: AC
Start: 2023-03-12 — End: ?

## 2023-03-12 MED ORDER — DULOXETINE HCL 60 MG PO CPEP
60.0000 mg | ORAL_CAPSULE | ORAL | 2 refills | Status: AC
Start: 2023-03-12 — End: 2023-06-10

## 2023-03-12 MED ORDER — ZOLPIDEM TARTRATE 10 MG PO TABS
ORAL_TABLET | ORAL | 1 refills | Status: AC
Start: 2023-05-20 — End: ?

## 2023-03-12 MED ORDER — AMITRIPTYLINE HCL 10 MG PO TABS
40.0000 mg | ORAL_TABLET | Freq: Every day | ORAL | 2 refills | Status: AC
Start: 2023-03-12 — End: 2023-06-10

## 2023-03-12 MED ORDER — PERPHENAZINE 2 MG PO TABS: 2.0000 mg | ORAL_TABLET | Freq: Every day | ORAL | 2 refills | Status: AC

## 2023-03-12 MED ORDER — METHYLPHENIDATE HCL 20 MG PO TABS: ORAL_TABLET | ORAL | 0 refills | Status: AC

## 2023-03-12 MED ORDER — BUPROPION HCL ER (XL) 300 MG PO TB24: 300.0000 mg | ORAL_TABLET | Freq: Every morning | ORAL | 2 refills | Status: AC

## 2023-03-12 NOTE — Progress Notes (Signed)
Jeanne [name] [id number] [id number] 62 y.o.  Subjective:   Patient ID:  Jeanne [name] is a 62 y.o. (DOB Apr 14, 1961) female.  Chief Complaint:  Chief Complaint  Patient presents with   Follow-up    Anxiety, depression, ADHD, and insomnia    HPI Jeanne [name] presents to the office today for follow-up of anxiety, depression, ADHD, and insomnia. She reports that her mood has been "ok."  She reports some mild depression- "I'm never not depressed." Denies anxiety. She had some anxiety after school issue. Sleeping well. Energy and motivation are good. Appetite has been good. Denies difficulty with concentration. Denies SI.   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 She plans to visit her sister and best friend. She plans to exercise with husband twice a week.  Ambien last filled 02/25/23. Ritalin last filled 09/18/22.   Past medication trials: Prozac- took for approximately 30 to 40 years Zoloft Wellbutrin Amitriptyline Perphenazine Methylphenidate Adderall Abilify Lamictal-itching Cymbalta Latuda Cerefolin  AIMS    Flowsheet Row Office Visit from 03/12/2023 in [facility] Office Visit from 10/01/2021 in [facility] Office Visit from 04/13/2020 in [facility] Office Visit from 09/21/2019 in [facility] Office Visit from 06/01/2019 in [facility]  AIMS Total Score 0 1 1 1  0        Review of Systems:  Review of Systems  Musculoskeletal:  Negative for gait problem.  Neurological:  Negative for tremors.  Psychiatric/Behavioral:         Please refer to HPI    Medications: I have reviewed the patient's current medications.  Current Outpatient Medications  Medication Sig Dispense Refill   Calcium Carbonate-Vitamin D (CALCIUM + D PO) Take 1 tablet by mouth 2 (two) times daily.       omeprazole (PRILOSEC) 20 MG capsule Take 20 mg by mouth daily.     Polyethyl Glycol-Propyl Glycol (LUBRICANT EYE DROPS) 0.4-0.3 % SOLN Place 1 drop into both eyes 3 (three) times daily as needed (dry/irritated eyes.).     polyethylene glycol powder (GLYCOLAX/MIRALAX) 17 GM/SCOOP powder Take 17 g by mouth at bedtime.      solifenacin (VESICARE) 5 MG tablet Take by mouth.     valACYclovir (VALTREX) 500 MG tablet Take 1 tablet (500 mg total) by mouth 2 (two) times daily. Take at first sign of outbreak for 3-5 days 30 tablet 2   amitriptyline (ELAVIL) 10 MG tablet Take 4 tablets (40 mg total) by mouth at bedtime. 360 tablet 2   atorvastatin (LIPITOR) 10 MG tablet Take 10 mg by mouth daily.     buPROPion (WELLBUTRIN XL) 300 MG 24 hr tablet Take 1 tablet (300 mg total) by mouth every morning. 90 tablet 2   DULoxetine (CYMBALTA) 30 MG capsule TAKE ONE CAPSULE BY MOUTH DAILY TAKE WITH 60 MG CAPSULE TO EQUAL A TOTAL DOSE OF 90 MG 90 capsule 2   DULoxetine (CYMBALTA) 60 MG capsule Take 1 capsule (60 mg total) by mouth every morning. Take with 30 mg capsule to equal 90 mg qd 90 capsule 2   GABAPENTIN PO Take 900 mg by mouth 2 (two) times daily.     methylphenidate (RITALIN) 20 MG tablet Take 1 tablet (20 mg total) by mouth every morning AND 0.5 tablets (10 mg total) daily in the afternoon. 135 tablet 0   [START ON 06/04/2023] methylphenidate (RITALIN)  20 MG tablet Take 1 tablet (20 mg total) by mouth every morning AND 0.5 tablets (10 mg total) daily at 12 noon. 135 tablet 0   perphenazine (TRILAFON) 2 MG tablet Take 1 tablet (2 mg total) by mouth at bedtime. 90 tablet 2   [START ON 05/20/2023] zolpidem (AMBIEN) 10 MG tablet TAKE 1 AND 1/2 TABLET BY MOUTH EVERY NIGHT AT BEDTIME 135 tablet 1   No current facility-administered medications for this visit.    Medication Side Effects: Other: Dry mouth  Allergies:  Allergies  Allergen Reactions   Lamictal [Lamotrigine] Itching    Past Medical History:   Diagnosis Date   Acid reflux    Anxiety    Back pain    Depression    Elevated cholesterol    HSV (herpes simplex virus) anogenital infection    Hx gestational diabetes    Hyperprolactinemia (HCC)    valued 18 with normal MRI 2012   Osteopenia 06/2019   T score -1.1 stable from prior DEXA FRAX 11% / 0.3%   Osteoporosis    Overactive bladder     Past Medical History, Surgical history, Social history, and Family history were reviewed and updated as appropriate.   Please see review of systems for further details on the patient's review from today.   Objective:   Physical Exam:  BP 132/85   Pulse 85   LMP 07/01/2002   Physical Exam Constitutional:      General: She is not in acute distress. Musculoskeletal:        General: No deformity.  Neurological:     Mental Status: She is alert and oriented to person, place, and time.     Coordination: Coordination normal.  Psychiatric:        Attention and Perception: Attention and perception normal. She does not perceive auditory or visual hallucinations.        Mood and Affect: Mood is not anxious. Affect is not labile, blunt, angry or inappropriate.        Speech: Speech normal.        Behavior: Behavior normal.        Thought Content: Thought content normal. Thought content is not paranoid or delusional. Thought content does not include homicidal or suicidal ideation. Thought content does not include homicidal or suicidal plan.        Cognition and Memory: Cognition and memory normal.        Judgment: Judgment normal.     Comments: Insight intact Mood is mildly depressed     Lab Review:     Component Value Date/Time   NA 134 (L) 07/05/2019 1436   K 4.6 07/05/2019 1436   CL 97 (L) 07/05/2019 1436   CO2 30 07/05/2019 1436   GLUCOSE 87 07/05/2019 1436   BUN 21 (H) 07/05/2019 1436   CREATININE 0.93 07/05/2019 1436   CREATININE 0.97 02/11/2017 0838   CALCIUM 9.1 07/05/2019 1436   PROT 6.6 02/11/2017 0838   ALBUMIN 4.4  02/11/2017 0838   AST 19 02/11/2017 0838   ALT 18 02/11/2017 0838   ALKPHOS 68 02/11/2017 0838   BILITOT 0.4 02/11/2017 0838   GFRNONAA >60 07/05/2019 1436   GFRAA >60 07/05/2019 1436       Component Value Date/Time   WBC 6.0 07/05/2019 1436   RBC 4.22 07/05/2019 1436   HGB 12.7 07/05/2019 1436   HCT 39.6 07/05/2019 1436   PLT 281 07/05/2019 1436   MCV 93.8 07/05/2019 1436   MCH 30.1 07/05/2019  1436   MCHC 32.1 07/05/2019 1436   RDW 13.1 07/05/2019 1436   LYMPHSABS 1.4 02/03/2015 0943   MONOABS 0.4 02/03/2015 0943   EOSABS 0.1 02/03/2015 0943   BASOSABS 0.1 02/03/2015 0943    No results found for: "POCLITH", "LITHIUM"   No results found for: "PHENYTOIN", "PHENOBARB", "VALPROATE", "CBMZ"   .res Assessment: Plan:    Will continue current plan of care since target signs and symptoms are well controlled without any tolerability issues. Pt to follow-up in 6 months or sooner if clinically indicated.  Patient advised to contact office with any questions, adverse effects, or acute worsening in signs and symptoms.   Jeanne [name] was seen today for follow-up.  Diagnoses and all orders for this visit:  Recurrent major depressive disorder, in full remission (HCC) -     amitriptyline (ELAVIL) 10 MG tablet; Take 4 tablets (40 mg total) by mouth at bedtime. -     buPROPion (WELLBUTRIN XL) 300 MG 24 hr tablet; Take 1 tablet (300 mg total) by mouth every morning. -     DULoxetine (CYMBALTA) 60 MG capsule; Take 1 capsule (60 mg total) by mouth every morning. Take with 30 mg capsule to equal 90 mg qd -     DULoxetine (CYMBALTA) 30 MG capsule; TAKE ONE CAPSULE BY MOUTH DAILY TAKE WITH 60 MG CAPSULE TO EQUAL A TOTAL DOSE OF 90 MG -     perphenazine (TRILAFON) 2 MG tablet; Take 1 tablet (2 mg total) by mouth at bedtime.  Insomnia, unspecified type -     amitriptyline (ELAVIL) 10 MG tablet; Take 4 tablets (40 mg total) by mouth at bedtime. -     zolpidem (AMBIEN) 10 MG tablet; TAKE 1 AND 1/2  TABLET BY MOUTH EVERY NIGHT AT BEDTIME  Attention deficit hyperactivity disorder (ADHD), predominantly inattentive type -     methylphenidate (RITALIN) 20 MG tablet; Take 1 tablet (20 mg total) by mouth every morning AND 0.5 tablets (10 mg total) daily in the afternoon. -     methylphenidate (RITALIN) 20 MG tablet; Take 1 tablet (20 mg total) by mouth every morning AND 0.5 tablets (10 mg total) daily at 12 noon.     Please see After Visit Summary for patient specific instructions.  Future Appointments  Date Time Provider Department Center  09/09/2023  9:00 AM Corie Chiquito, PMHNP [facility] None    No orders of the defined types were placed in this encounter.   -------------------------------

## 2023-05-14 ENCOUNTER — Encounter: Payer: Self-pay | Admitting: Psychiatry

## 2023-08-06 ENCOUNTER — Ambulatory Visit (INDEPENDENT_AMBULATORY_CARE_PROVIDER_SITE_OTHER): Payer: Commercial Managed Care - PPO | Admitting: Nurse Practitioner

## 2023-08-06 ENCOUNTER — Encounter: Payer: Self-pay | Admitting: Nurse Practitioner

## 2023-08-06 ENCOUNTER — Other Ambulatory Visit (HOSPITAL_COMMUNITY)
Admission: RE | Admit: 2023-08-06 | Discharge: 2023-08-06 | Disposition: A | Discharge status: Home / Self Care | Payer: Commercial Managed Care - PPO | Source: Ambulatory Visit | Attending: Nurse Practitioner | Admitting: Nurse Practitioner

## 2023-08-06 VITALS — BP 118/74 | HR 97 | Ht 61.5 in | Wt 143.0 lb

## 2023-08-06 DIAGNOSIS — Z780 Asymptomatic menopausal state: Secondary | ICD-10-CM

## 2023-08-06 DIAGNOSIS — Z124 Encounter for screening for malignant neoplasm of cervix: Secondary | ICD-10-CM | POA: Insufficient documentation

## 2023-08-06 DIAGNOSIS — Z8739 Personal history of other diseases of the musculoskeletal system and connective tissue: Secondary | ICD-10-CM | POA: Diagnosis not present

## 2023-08-06 DIAGNOSIS — Z01419 Encounter for gynecological examination (general) (routine) without abnormal findings: Secondary | ICD-10-CM | POA: Diagnosis not present

## 2023-08-06 DIAGNOSIS — B009 Herpesviral infection, unspecified: Secondary | ICD-10-CM

## 2023-08-06 MED ORDER — VALACYCLOVIR HCL 500 MG PO TABS
500.0000 mg | ORAL_TABLET | Freq: Two times a day (BID) | ORAL | 2 refills | Status: AC
Start: 2023-08-06 — End: ?

## 2023-08-06 NOTE — Progress Notes (Signed)
 Jeanne [name] [birth year] [id number]   History:  63 y.o. [id number] presents for annual exam. Postmenopausal - no HRT, no bleeding. Normal pap and mammogram history. History of hyperprolactinemia - normal MRI and levels, no longer screening. Bone health, HLD managed by PCP, was on Fosamax in the past. Most recent DXA in 2023 normal. ADHD, MDD managed by psychiatry.   Gynecologic History Patient's last menstrual period was 07/01/2002.   Contraception/Family planning: post menopausal status Sexually active: Yes  Health Maintenance Last Pap: 07/03/2022. Results were: Normal Last mammogram: 2024. Results were: Normal per patient Last colonoscopy: 01/2022. Results were: Normal, 10-year recall Last Dexa: 08/21/2021. Results were: Normal  Past medical history, past surgical history, family history and social history were all reviewed and documented in the EPIC chart. Married. 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 3 sons, 2 stepdaughters. 4 grandchildren.   ROS:  A ROS was performed and pertinent positives and negatives are included.  Exam:  Vitals:   08/06/23 0840  BP: 118/74  Pulse: 97  SpO2: 98%  Weight: 143 lb (64.9 kg)  Height: 5' 1.5 (1.562 m)     Body mass index is 26.58 kg/m.  General appearance:  Normal Thyroid:  Symmetrical, normal in size, without palpable masses or nodularity. Respiratory  Auscultation:  Clear without wheezing or rhonchi Cardiovascular  Auscultation:  Regular rate, without rubs, murmurs or gallops  Edema/varicosities:  Not grossly evident Abdominal  Soft,nontender, without masses, guarding or rebound.  Liver/spleen:  No organomegaly noted  Hernia:  None appreciated  Skin  Inspection:  Grossly normal Breasts: Examined lying and sitting.   Right: Without masses, retractions, nipple discharge or axillary adenopathy.   Left: Without masses, retractions, nipple discharge or axillary adenopathy. Pelvic: External  genitalia:  no lesions              Urethra:  normal appearing urethra with no masses, tenderness or lesions              Bartholins and Skenes: normal                 Vagina: normal appearing vagina with normal color and discharge, no lesions. Atrophic changes              Cervix: no lesions Bimanual Exam:  Uterus:  no masses or tenderness              Adnexa: no mass, fullness, tenderness              Rectovaginal: Deferred              Anus:  normal, no lesions  Patient informed chaperone available to be present for breast and pelvic exam. Patient has requested no chaperone to be present. Patient has been advised what will be completed during breast and pelvic exam.   Assessment/Plan:  63 y.o. [phone] for annual exam.   Well female exam with routine gynecological exam - Education provided on SBEs, importance of preventative screenings, current guidelines, high calcium diet, regular exercise, and multivitamin daily.  Labs with PCP.   History of osteopenia - Managed by PCP. Was on Fosamax in the past. Normal bone density in February 2023. Continue Vitamin D  + Calcium and regular exercise.   Postmenopausal - No HRT, no bleeding.   HSV-1 (herpes simplex virus 1) infection - Plan: valACYclovir  (VALTREX ) 500 MG tablet daily x 3-5 days at first sign of outbreak. Rare outbreaks.   Cervical cancer screening -  Plan: Cytology - PAP( [facility]). Normal Pap history. Discussed current guidelines and recommendations to repeat at 5-year interval. Requests annual paps.   Screening for breast cancer - Normal mammogram history.  Continue annual screenings.  Normal breast exam today.  Screening for colon cancer - 01/2022 colonoscopy. Will repeat at 10-year interval per GI's recommendation.   Return in about 1 year (around 08/05/2024) for Annual.    Jeanne DELENA Shutter DNP, 9:45 AM 08/06/2023

## 2023-08-13 LAB — CYTOLOGY - PAP
Comment: NEGATIVE
Diagnosis: NEGATIVE
High risk HPV: NEGATIVE

## 2023-08-14 ENCOUNTER — Encounter: Payer: Self-pay | Admitting: Nurse Practitioner

## 2023-09-09 ENCOUNTER — Ambulatory Visit: Payer: Commercial Managed Care - PPO | Admitting: Psychiatry

## 2024-01-13 LAB — HM MAMMOGRAPHY

## 2024-01-19 ENCOUNTER — Encounter: Payer: Self-pay | Admitting: Nurse Practitioner

## 2024-08-09 ENCOUNTER — Ambulatory Visit: Admitting: Nurse Practitioner
